# Patient Record
Sex: Female | Born: 1970 | Race: White | Hispanic: No | Marital: Married | Smoking: Current some day smoker
Health system: Southern US, Community
[De-identification: ages and names within clinical notes are randomized; demographics above are authoritative.]

## PROBLEM LIST (undated history)

## (undated) DIAGNOSIS — I7300 Raynaud's syndrome without gangrene: Secondary | ICD-10-CM

## (undated) DIAGNOSIS — F17200 Nicotine dependence, unspecified, uncomplicated: Secondary | ICD-10-CM

## (undated) DIAGNOSIS — K644 Residual hemorrhoidal skin tags: Secondary | ICD-10-CM

## (undated) HISTORY — DX: Raynaud's syndrome without gangrene: I73.00

## (undated) HISTORY — DX: Residual hemorrhoidal skin tags: K64.4

## (undated) HISTORY — DX: Nicotine dependence, unspecified, uncomplicated: F17.200

## (undated) HISTORY — PX: WISDOM TOOTH EXTRACTION: SHX21

---

## 1997-08-05 HISTORY — PX: MOUTH SURGERY: SHX715

## 2006-01-01 ENCOUNTER — Other Ambulatory Visit: Admission: RE | Admit: 2006-01-01 | Discharge: 2006-01-01 | Payer: Self-pay | Admitting: Gynecology

## 2007-04-09 ENCOUNTER — Encounter: Admission: RE | Admit: 2007-04-09 | Discharge: 2007-04-09 | Payer: Self-pay | Admitting: Obstetrics and Gynecology

## 2008-07-01 ENCOUNTER — Inpatient Hospital Stay (HOSPITAL_COMMUNITY): Admission: AD | Admit: 2008-07-01 | Discharge: 2008-07-03 | Payer: Self-pay | Admitting: Obstetrics and Gynecology

## 2010-04-27 ENCOUNTER — Emergency Department (HOSPITAL_COMMUNITY): Admission: EM | Admit: 2010-04-27 | Discharge: 2010-04-27 | Payer: Self-pay | Admitting: Emergency Medicine

## 2010-08-05 NOTE — L&D Delivery Note (Signed)
Delivery Note Pt received an epidural, progressed quickly to complete and pushed very well.  At 11:41 AM a viable and healthy female was delivered via Vaginal, Spontaneous Delivery (Presentation: Right Occiput Anterior).  APGAR: 9, 10; weight 8 lbs 5 oz .   Placenta status: Intact, Spontaneous.  Cord: 3 vessels with the following complications: None.  Anesthesia: Epidural  Episiotomy: None Lacerations: 2nd degree;Perineal Suture Repair: 3.0 vicryl Est. Blood Loss (mL):   Mom to postpartum.  Baby to nursery-stable.  Caitlen Worth D 05/27/2011, 12:04 PM

## 2010-08-26 ENCOUNTER — Encounter: Payer: Self-pay | Admitting: Obstetrics and Gynecology

## 2010-11-06 LAB — GC/CHLAMYDIA PROBE AMP, GENITAL: Chlamydia: NEGATIVE

## 2010-11-06 LAB — ANTIBODY SCREEN: Antibody Screen: NEGATIVE

## 2010-11-06 LAB — RUBELLA ANTIBODY, IGM: Rubella: NON-IMMUNE/NOT IMMUNE

## 2010-11-06 LAB — ABO/RH: RH Type: POSITIVE

## 2010-12-18 NOTE — Discharge Summary (Signed)
NAMEGWYNN, Anna Jimenez                ACCOUNT NO.:  [id number]   MEDICAL RECORD NO.:  [id number]          PATIENT TYPE:  INP   LOCATION:  9103                          FACILITY:  [facility]   PHYSICIAN:  Zenaida Niece, M.D.DATE OF BIRTH:  [birth year]   DATE OF ADMISSION:  07/01/2008  DATE OF DISCHARGE:  07/03/2008                               DISCHARGE SUMMARY   ADMISSION DIAGNOSIS:  Intrauterine pregnancy at 38 weeks.   DISCHARGE DIAGNOSIS:  Intrauterine pregnancy at 38 weeks.   PROCEDURE:  On July 01, 2008, she had a spontaneous vaginal delivery  with a third-degree laceration.   HISTORY AND PHYSICAL:  This is a 40 year old gravida 1, para 0 with an  EGA of [redacted] weeks, who presents with a complaint of leaking fluid since  about 0845 on the day of admission.  Prenatal care essentially  uncomplicated.   PRENATAL LABS:  Blood type is O positive with negative antibody screen,  RPR nonreactive, rubella nonimmune, HIV negative, gonorrhea and  chlamydia negative, first-trimester screen is normal, MSAFP is normal, 1-  hour Glucola 103, group B strep is negative.   PAST HISTORY:  She has an allergy to PENICILLIN.  No other significant  medical problems.   PHYSICAL EXAMINATION:  GENERAL:  She is afebrile with stable vital  signs.  Fetal heart tracing is normal.  ABDOMEN:  Soft.  On the fetal heart tracing, she also has contractions  every 2-3 minutes.  Cervix; 2-3, 90, vertex -1 to -2.   HOSPITAL COURSE:  The patient was admitted and continued to progress on  her own.  She received an epidural.  She progressed to complete, pushed  well and on the afternoon of July 01, 2008, had a vaginal delivery  of a viable female infant with Apgars of 9 and 9, weight 8 pounds and 3  ounces.  There was an occult cold cord prolapse.  Placenta was delivered  intact.  Uterus palpated normal.  She had a third-degree laceration  repaired with 1% lidocaine.  Estimated blood loss was 400 mL.  Postpartum,  she had no significant complications.  She remained  afebrile.  Predelivery hemoglobin 12.9 and postdelivery 10.4.  Predelivery platelets 119,000 and postdelivery platelets 98,000.  On  postpartum #2, she was felt to be stable enough for discharge home.   DISCHARGE INSTRUCTIONS:  Regular diet and pelvic rest.  Follow up in 6  weeks.   MEDICATIONS:  Over-the-counter ibuprofen as needed and she was given our  discharge pamphlet.      Zenaida Niece, M.D.  Electronically Signed     TDM/MEDQ  D:  07/03/2008  T:  07/03/2008  Job:  [id number]

## 2011-05-07 LAB — CBC
HCT: 37.5
MCHC: 34.3
MCHC: 34.4
MCV: 93.7
MCV: 94.3
Platelets: 119 — ABNORMAL LOW
Platelets: 98 — ABNORMAL LOW
RBC: 3.22 — ABNORMAL LOW
RDW: 12.9
RDW: 13.1

## 2011-05-15 ENCOUNTER — Telehealth (HOSPITAL_COMMUNITY): Payer: Self-pay | Admitting: *Deleted

## 2011-05-15 ENCOUNTER — Encounter (HOSPITAL_COMMUNITY): Payer: Self-pay | Admitting: *Deleted

## 2011-05-15 NOTE — Telephone Encounter (Signed)
Preadmission screen  

## 2011-05-24 ENCOUNTER — Inpatient Hospital Stay (HOSPITAL_COMMUNITY): Admission: RE | Admit: 2011-05-24 | Payer: Self-pay | Source: Ambulatory Visit

## 2011-05-27 ENCOUNTER — Encounter (HOSPITAL_COMMUNITY): Payer: Self-pay | Admitting: *Deleted

## 2011-05-27 ENCOUNTER — Inpatient Hospital Stay (HOSPITAL_COMMUNITY)
Admission: AD | Admit: 2011-05-27 | Discharge: 2011-05-28 | DRG: 373 | Disposition: A | Discharge status: Home / Self Care | Payer: BC Managed Care – PPO | Source: Ambulatory Visit | Attending: Obstetrics and Gynecology | Admitting: Obstetrics and Gynecology

## 2011-05-27 ENCOUNTER — Encounter (HOSPITAL_COMMUNITY): Payer: Self-pay | Admitting: Anesthesiology

## 2011-05-27 ENCOUNTER — Inpatient Hospital Stay (HOSPITAL_COMMUNITY): Payer: BC Managed Care – PPO | Admitting: Anesthesiology

## 2011-05-27 DIAGNOSIS — IMO0002 Reserved for concepts with insufficient information to code with codable children: Secondary | ICD-10-CM | POA: Diagnosis not present

## 2011-05-27 HISTORY — DX: Reserved for concepts with insufficient information to code with codable children: IMO0002

## 2011-05-27 LAB — CBC
HCT: 35.8 % — ABNORMAL LOW (ref 36.0–46.0)
Hemoglobin: 12.1 g/dL (ref 12.0–15.0)
MCH: 30.6 pg (ref 26.0–34.0)
MCV: 90.6 fL (ref 78.0–100.0)
RBC: 3.95 MIL/uL (ref 3.87–5.11)

## 2011-05-27 LAB — RPR: RPR Ser Ql: NONREACTIVE

## 2011-05-27 MED ORDER — BENZOCAINE-MENTHOL 20-0.5 % EX AERO
1.0000 "application " | INHALATION_SPRAY | CUTANEOUS | Status: DC | PRN
  Administered 2011-05-28: 1 via TOPICAL
  Filled 2011-05-27: qty 56

## 2011-05-27 MED ORDER — PHENYLEPHRINE 40 MCG/ML (10ML) SYRINGE FOR IV PUSH (FOR BLOOD PRESSURE SUPPORT): 80.0000 ug | PREFILLED_SYRINGE | INTRAVENOUS | Status: DC | PRN

## 2011-05-27 MED ORDER — LACTATED RINGERS IV SOLN
INTRAVENOUS | Status: DC
  Administered 2011-05-27 (×2): via INTRAVENOUS

## 2011-05-27 MED ORDER — LACTATED RINGERS IV SOLN: 500.0000 mL | Freq: Once | INTRAVENOUS | Status: DC

## 2011-05-27 MED ORDER — TETANUS-DIPHTH-ACELL PERTUSSIS 5-2.5-18.5 LF-MCG/0.5 IM SUSP
0.5000 mL | Freq: Once | INTRAMUSCULAR | Status: DC
  Filled 2011-05-27: qty 0.5

## 2011-05-27 MED ORDER — ACETAMINOPHEN 325 MG PO TABS: 650.0000 mg | ORAL_TABLET | ORAL | Status: DC | PRN

## 2011-05-27 MED ORDER — LANOLIN HYDROUS EX OINT: TOPICAL_OINTMENT | CUTANEOUS | Status: DC | PRN

## 2011-05-27 MED ORDER — METHYLERGONOVINE MALEATE 0.2 MG PO TABS: 0.2000 mg | ORAL_TABLET | ORAL | Status: DC | PRN

## 2011-05-27 MED ORDER — ONDANSETRON HCL 4 MG/2ML IJ SOLN: 4.0000 mg | INTRAMUSCULAR | Status: DC | PRN

## 2011-05-27 MED ORDER — METHYLERGONOVINE MALEATE 0.2 MG/ML IJ SOLN: 0.2000 mg | INTRAMUSCULAR | Status: DC | PRN

## 2011-05-27 MED ORDER — FENTANYL 2.5 MCG/ML BUPIVACAINE 1/10 % EPIDURAL INFUSION (WH - ANES)
14.0000 mL/h | INTRAMUSCULAR | Status: DC
  Filled 2011-05-27: qty 60

## 2011-05-27 MED ORDER — MEASLES, MUMPS & RUBELLA VAC ~~LOC~~ INJ
0.5000 mL | INJECTION | Freq: Once | SUBCUTANEOUS | Status: AC
  Administered 2011-05-28: 0.5 mL via SUBCUTANEOUS
  Filled 2011-05-27: qty 0.5

## 2011-05-27 MED ORDER — OXYTOCIN BOLUS FROM INFUSION
500.0000 mL | Freq: Once | INTRAVENOUS | Status: DC
  Filled 2011-05-27: qty 500
  Filled 2011-05-27: qty 1000

## 2011-05-27 MED ORDER — TETANUS-DIPHTH-ACELL PERTUSSIS 5-2.5-18.5 LF-MCG/0.5 IM SUSP
0.5000 mL | Freq: Once | INTRAMUSCULAR | Status: AC
  Administered 2011-05-27: 0.5 mL via INTRAMUSCULAR

## 2011-05-27 MED ORDER — LACTATED RINGERS IV SOLN: 500.0000 mL | INTRAVENOUS | Status: DC | PRN

## 2011-05-27 MED ORDER — PRENATAL PLUS 27-1 MG PO TABS
1.0000 | ORAL_TABLET | Freq: Every day | ORAL | Status: DC
  Administered 2011-05-28: 1 via ORAL
  Filled 2011-05-27: qty 1

## 2011-05-27 MED ORDER — SODIUM BICARBONATE 8.4 % IV SOLN
INTRAVENOUS | Status: DC | PRN
  Administered 2011-05-27: 3 mL via EPIDURAL

## 2011-05-27 MED ORDER — OXYTOCIN 20 UNITS IN LACTATED RINGERS INFUSION - SIMPLE
125.0000 mL/h | Freq: Once | INTRAVENOUS | Status: AC
  Administered 2011-05-27: 125 mL/h via INTRAVENOUS

## 2011-05-27 MED ORDER — IBUPROFEN 600 MG PO TABS
600.0000 mg | ORAL_TABLET | Freq: Four times a day (QID) | ORAL | Status: DC
  Administered 2011-05-27 – 2011-05-28 (×4): 600 mg via ORAL
  Filled 2011-05-27 (×4): qty 1

## 2011-05-27 MED ORDER — EPHEDRINE 5 MG/ML INJ
10.0000 mg | INTRAVENOUS | Status: DC | PRN
  Filled 2011-05-27: qty 4

## 2011-05-27 MED ORDER — DIBUCAINE 1 % RE OINT
1.0000 "application " | TOPICAL_OINTMENT | RECTAL | Status: DC | PRN
  Filled 2011-05-27: qty 28

## 2011-05-27 MED ORDER — EPHEDRINE 5 MG/ML INJ: 10.0000 mg | INTRAVENOUS | Status: DC | PRN

## 2011-05-27 MED ORDER — DIPHENHYDRAMINE HCL 50 MG/ML IJ SOLN: 12.5000 mg | INTRAMUSCULAR | Status: DC | PRN

## 2011-05-27 MED ORDER — PHENYLEPHRINE 40 MCG/ML (10ML) SYRINGE FOR IV PUSH (FOR BLOOD PRESSURE SUPPORT)
80.0000 ug | PREFILLED_SYRINGE | INTRAVENOUS | Status: DC | PRN
  Filled 2011-05-27: qty 5

## 2011-05-27 MED ORDER — MAGNESIUM HYDROXIDE 400 MG/5ML PO SUSP: 30.0000 mL | ORAL | Status: DC | PRN

## 2011-05-27 MED ORDER — SIMETHICONE 80 MG PO CHEW: 80.0000 mg | CHEWABLE_TABLET | ORAL | Status: DC | PRN

## 2011-05-27 MED ORDER — OXYCODONE-ACETAMINOPHEN 5-325 MG PO TABS
1.0000 | ORAL_TABLET | ORAL | Status: DC | PRN
  Administered 2011-05-28: 1 via ORAL
  Filled 2011-05-27: qty 1

## 2011-05-27 MED ORDER — LIDOCAINE HCL (PF) 1 % IJ SOLN
30.0000 mL | INTRAMUSCULAR | Status: DC | PRN
  Filled 2011-05-27: qty 30

## 2011-05-27 MED ORDER — FENTANYL 2.5 MCG/ML BUPIVACAINE 1/10 % EPIDURAL INFUSION (WH - ANES)
INTRAMUSCULAR | Status: DC | PRN
  Administered 2011-05-27: 12 mL/h via EPIDURAL

## 2011-05-27 MED ORDER — CITRIC ACID-SODIUM CITRATE 334-500 MG/5ML PO SOLN: 30.0000 mL | ORAL | Status: DC | PRN

## 2011-05-27 MED ORDER — OXYCODONE-ACETAMINOPHEN 5-325 MG PO TABS: 2.0000 | ORAL_TABLET | ORAL | Status: DC | PRN

## 2011-05-27 MED ORDER — DIPHENHYDRAMINE HCL 25 MG PO CAPS: 25.0000 mg | ORAL_CAPSULE | Freq: Four times a day (QID) | ORAL | Status: DC | PRN

## 2011-05-27 MED ORDER — OXYTOCIN 20 UNITS IN LACTATED RINGERS INFUSION - SIMPLE: 125.0000 mL/h | INTRAVENOUS | Status: DC | PRN

## 2011-05-27 MED ORDER — WITCH HAZEL-GLYCERIN EX PADS: 1.0000 "application " | MEDICATED_PAD | CUTANEOUS | Status: DC | PRN

## 2011-05-27 MED ORDER — ONDANSETRON HCL 4 MG PO TABS: 4.0000 mg | ORAL_TABLET | ORAL | Status: DC | PRN

## 2011-05-27 MED ORDER — ZOLPIDEM TARTRATE 5 MG PO TABS: 5.0000 mg | ORAL_TABLET | Freq: Every evening | ORAL | Status: DC | PRN

## 2011-05-27 MED ORDER — ONDANSETRON HCL 4 MG/2ML IJ SOLN: 4.0000 mg | Freq: Four times a day (QID) | INTRAMUSCULAR | Status: DC | PRN

## 2011-05-27 MED ORDER — LIDOCAINE HCL 1.5 % IJ SOLN
INTRAMUSCULAR | Status: DC | PRN
  Administered 2011-05-27 (×2): 5 mL via EPIDURAL

## 2011-05-27 MED ORDER — IBUPROFEN 600 MG PO TABS: 600.0000 mg | ORAL_TABLET | Freq: Four times a day (QID) | ORAL | Status: DC | PRN

## 2011-05-27 MED ORDER — SENNOSIDES-DOCUSATE SODIUM 8.6-50 MG PO TABS
2.0000 | ORAL_TABLET | Freq: Every day | ORAL | Status: DC
  Administered 2011-05-27: 2 via ORAL

## 2011-05-27 NOTE — Progress Notes (Signed)
?  srom at 0430, big gush, then trickling.  Started as clear, then pink tinged.  Ctx's started after rupture, are mild.

## 2011-05-27 NOTE — Anesthesia Procedure Notes (Signed)
Epidural Patient location during procedure: OB Start time: 05/27/2011 10:21 AM End time: 05/27/2011 10:27 AM Reason for block: procedure for pain  Staffing Anesthesiologist: Sandrea Hughs Performed by: anesthesiologist   Preanesthetic Checklist Completed: patient identified, site marked, surgical consent, pre-op evaluation, timeout performed, IV checked, risks and benefits discussed and monitors and equipment checked  Epidural Patient position: sitting Prep: site prepped and draped and DuraPrep Patient monitoring: continuous pulse ox and blood pressure Approach: midline Injection technique: LOR air  Needle:  Needle type: Tuohy  Needle gauge: 17 G Needle length: 9 cm Needle insertion depth: 6 cm Catheter type: closed end flexible Catheter size: 19 Gauge Catheter at skin depth: 11 cm Test dose: negative and 1.5% lidocaine  Assessment Sensory level: T9 Events: blood not aspirated, injection not painful, no injection resistance, negative IV test and no paresthesia

## 2011-05-27 NOTE — Anesthesia Preprocedure Evaluation (Signed)
Anesthesia Evaluation  Patient identified by MRN, date of birth, ID band Patient awake  General Assessment Comment  Reviewed: Allergy & Precautions, H&P , NPO status , Patient's Chart, lab work & pertinent test results  Airway Mallampati: II TM Distance: >3 FB Neck ROM: full    Dental No notable dental hx.    Pulmonary  clear to auscultation  Pulmonary exam normal       Cardiovascular Normal    Neuro/Psych Negative Neurological ROS  Negative Psych ROS   GI/Hepatic negative GI ROS Neg liver ROS    Endo/Other  Negative Endocrine ROS  Renal/GU negative Renal ROS  Genitourinary negative   Musculoskeletal   Abdominal Normal abdominal exam  (+)   Peds negative pediatric ROS (+)  Hematology negative hematology ROS (+)   Anesthesia Other Findings   Reproductive/Obstetrics (+) Pregnancy                           Anesthesia Physical Anesthesia Plan  ASA: II  Anesthesia Plan: Epidural   Post-op Pain Management:    Induction:   Airway Management Planned:   Additional Equipment:   Intra-op Plan:   Post-operative Plan:   Informed Consent: I have reviewed the patients History and Physical, chart, labs and discussed the procedure including the risks, benefits and alternatives for the proposed anesthesia with the patient or authorized representative who has indicated his/her understanding and acceptance.     Plan Discussed with:   Anesthesia Plan Comments:         Anesthesia Quick Evaluation

## 2011-05-27 NOTE — H&P (Signed)
Anna Jimenez is a 40 y.o. female, G2 P1001, EGA [redacted] weeks presenting for evaluation of leaking fluid since about 0430 today, some ctx.  She had a large gush of fluid, some trickled after that.  On evaluation here, not grossly ruptured, fern neg.   Prenatal care essentially uncomplicated, see prenatal records for complete history.    Maternal Medical History:  Reason for admission: Reason for admission: rupture of membranes.  Contractions: Frequency: irregular.   Perceived severity is moderate.    Fetal activity: Perceived fetal activity is normal.    Prenatal complications: no prenatal complications   OB History    Grav Para Term Preterm Abortions TAB SAB Ect Mult Living   2 1 1       1      Past Medical History  Diagnosis Date  . Raynaud's disease    Past Surgical History  Procedure Date  . Mouth surgery 1999   Family History: family history includes Diabetes in her maternal grandfather; Hypertension in her mother; and Polymyositis in her mother.  There is no history of Anesthesia problems, and Hypotension, and Malignant hyperthermia, and Pseudochol deficiency, . Social History:  reports that she has never smoked. She has never used smokeless tobacco. She reports that she does not drink alcohol or use illicit drugs.  Review of Systems  Respiratory: Negative.   Cardiovascular: Negative.    VE- 4/80/-1, vtx, AROM of at least a forebag- clear  VBlood pressure 125/88, pulse 81, temperature 98.7 F (37.1 C), temperature source Oral, resp. rate 16, height 5' 9.5" (1.765 m), weight 104.951 kg (231 lb 6 oz). Maternal Exam:  Uterine Assessment: Contraction strength is moderate.  Contraction frequency is irregular.   Abdomen: Patient reports no abdominal tenderness. Estimated fetal weight is 7 1/2 lbs.   Fetal presentation: vertex  Introitus: Normal vulva. Normal vagina.    Fetal Exam Fetal Monitor Review: Mode: ultrasound.   Baseline rate: 140s.  Variability: minimal (<5 bpm).    Pattern: no decelerations and no accelerations.    Fetal State Assessment: Category II - tracings are indeterminate.     Physical Exam  Constitutional: She appears well-developed and well-nourished.  Neck: Neck supple. No thyromegaly present.  Cardiovascular: Normal rate, regular rhythm and normal heart sounds.   No murmur heard. Respiratory: Breath sounds normal. No respiratory distress. She has no wheezes.  GI: Soft.    Prenatal labs: ABO, Rh: O/Positive/-- (04/03 0000) Antibody: Negative (04/03 0000) Rubella: Nonimmune (04/03 0000) RPR: Nonreactive (04/03 0000)  HBsAg: Negative (04/03 0000)  HIV: Non-reactive (04/03 0000)  GBS: Negative (09/24 0000)   Assessment/Plan: IUP at 40 weeks, possibly with SROM but appears to be in early labor.  AROM of at least a forebag done for augmentation.  Will monitor progress.     Rosalie Gelpi D 05/27/2011, 8:46 AM

## 2011-05-28 MED ORDER — BENZOCAINE-MENTHOL 20-0.5 % EX AERO
INHALATION_SPRAY | CUTANEOUS | Status: AC
  Filled 2011-05-28: qty 56

## 2011-05-28 MED ORDER — OXYCODONE-ACETAMINOPHEN 5-325 MG PO TABS: 1.0000 | ORAL_TABLET | ORAL | Status: AC | PRN

## 2011-05-28 NOTE — Anesthesia Postprocedure Evaluation (Signed)
Anesthesia Post Note  Patient: Anna Jimenez  Procedure(s) Performed: * No procedures listed *  Anesthesia type: Epidural  Patient location: Mother/Baby  Post pain: Pain level controlled  Post assessment: Post-op Vital signs reviewed  Last Vitals:  Filed Vitals:   05/28/11 0611  BP: 121/83  Pulse: 80  Temp: 36.8 C  Resp: 18    Post vital signs: Reviewed  Level of consciousness: awake  Complications: No apparent anesthesia complications

## 2011-05-28 NOTE — Discharge Summary (Signed)
Obstetric Discharge Summary Reason for Admission: rupture of membranes Prenatal Procedures: none Intrapartum Procedures: spontaneous vaginal delivery Postpartum Procedures: none Complications-Operative and Postpartum: 2nd degree perineal laceration Hemoglobin  Date Value Range Status  05/27/2011 12.1  12.0-15.0 (g/dL) Final     HCT  Date Value Range Status  05/27/2011 35.8* 36.0-46.0 (%) Final    Discharge Diagnoses: Term Pregnancy-delivered  Discharge Information: Date: 05/28/2011 Activity: pelvic rest Diet: routine Medications: Ibuprofen and Percocet Condition: stable Instructions: refer to practice specific booklet Discharge to: home Follow-up Information    Follow up with [name] D, MD. Make an appointment in 6 weeks.   Contact information:   [address] [place] [address] [phone]          Newborn Data: Live born female  Birth Weight: 8 lb 5.3 oz (3780 g) APGAR: 9, 10  Home with mother.  [name] D 05/28/2011, 8:27 AM

## 2011-05-28 NOTE — Progress Notes (Signed)
PPD#1 Doing well, wants to go home today Afeb, VSS Fundus- firm, NT at U-0 Continue routine postpartum care, d/c home if baby can go home.

## 2011-05-29 ENCOUNTER — Inpatient Hospital Stay (HOSPITAL_COMMUNITY): Admission: RE | Admit: 2011-05-29 | Payer: Self-pay | Source: Ambulatory Visit

## 2012-07-31 ENCOUNTER — Encounter (INDEPENDENT_AMBULATORY_CARE_PROVIDER_SITE_OTHER): Payer: Self-pay

## 2012-08-03 ENCOUNTER — Telehealth (INDEPENDENT_AMBULATORY_CARE_PROVIDER_SITE_OTHER): Payer: Self-pay | Admitting: General Surgery

## 2012-08-03 ENCOUNTER — Ambulatory Visit (INDEPENDENT_AMBULATORY_CARE_PROVIDER_SITE_OTHER): Payer: BC Managed Care – PPO | Admitting: General Surgery

## 2012-08-03 ENCOUNTER — Encounter (INDEPENDENT_AMBULATORY_CARE_PROVIDER_SITE_OTHER): Payer: Self-pay | Admitting: General Surgery

## 2012-08-03 VITALS — BP 124/82 | HR 88 | Temp 96.7°F | Resp 18 | Ht 70.0 in | Wt 166.0 lb

## 2012-08-03 DIAGNOSIS — K612 Anorectal abscess: Secondary | ICD-10-CM

## 2012-08-03 DIAGNOSIS — K611 Rectal abscess: Secondary | ICD-10-CM

## 2012-08-03 NOTE — Patient Instructions (Signed)
The swollen area that is draining appears to be a perirectal abscess in the anterior midline. This has recurred despite what sounds like adequate treatment.  You  will be scheduled for surgery in the near future for examination under anesthesia and drainage of the perirectal abscess. If we find a fistula in ano we will treat that as well.  Until the surgery is performed I recommended that you take a warm tub bath 2 or 3 times a day. You have also been given a prescription for Cipro and Flagyl antibiotic to take for 7 days.      Peri-Rectal Abscess Your caregiver has diagnosed you as having a peri-rectal abscess. This is an infected area near the rectum that is filled with pus. If the abscess is near the surface of the skin, your caregiver may open (incise) the area and drain the pus. HOME CARE INSTRUCTIONS   If your abscess was opened up and drained. A small piece of gauze may be placed in the opening so that it can drain. Do not remove the gauze unless directed by your caregiver.  A loose dressing may be placed over the abscess site. Change the dressing as often as necessary to keep it clean and dry.  After the drain is removed, the area may be washed with a gentle antiseptic (soap) four times per day.  A warm sitz bath, warm packs or heating pad may be used for pain relief, taking care not to burn yourself.  Return for a wound check in 1 day or as directed.  An "inflatable doughnut" may be used for sitting with added comfort. These can be purchased at a drugstore or medical supply house.  To reduce pain and straining with bowel movements, eat a high fiber diet with plenty of fruits and vegetables. Use stool softeners as recommended by your caregiver. This is especially important if narcotic type pain medications were prescribed as these may cause marked constipation.  Only take over-the-counter or prescription medicines for pain, discomfort, or fever as directed by your caregiver. SEEK  IMMEDIATE MEDICAL CARE IF:   You have increasing pain that is not controlled by medication.  There is increased inflammation (redness), swelling, bleeding, or drainage from the area.  An oral temperature above 102 F (38.9 C) develops.  You develop chills or generalized malaise (feel lethargic or feel "washed out").  You develop any new symptoms (problems) you feel may be related to your present problem. Document Released: 07/19/2000 Document Revised: 10/14/2011 Document Reviewed: 07/19/2008 [facility] Patient Information 2013 [place], [place].

## 2012-08-03 NOTE — Telephone Encounter (Signed)
Called patient per Dr. Jacinto Halim request and asked the patient to arrive earlier due to procedure scheduled during clinic. Patient agreed to get here as quickly as she can, due to the babysitter arriving at 10:00.

## 2012-08-03 NOTE — Progress Notes (Signed)
Patient ID: Anna Jimenez, female   DOB: 1971-06-05, 41 y.o.   MRN: [id number]  Chief Complaint  Patient presents with  . Pre-op Exam    evaul peri anal mass    HPI Anna Jimenez is a 41 y.o. female.  She is recurred by Dr. Lynnea Ferrier at [facility] for evaluation of a recurrent cystic mass on the perineum.  The patient has had some minor problems with hemorrhoids over the years. She has had a swollen area in the midline in the perineum for about 3 weeks. 2 weeks ago she underwent incision and drainage of this area and was placed on Bactrim. She improved transiently but over the past 2 weeks she has had cycles of swelling and discomfort and then discharge and improvement but continues to recur. This been no change in her bowel habits. Normal appetite. No abdominal pain. Otherwise healthy. No prior rectal surgery.  She does run regularly. 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 She has 2 children. OB delivery and episiotomy one year ago and she questions whether the sutures could have caused this. HPI  Past Medical History  Diagnosis Date  . Raynaud's disease   . External hemorrhoid     Past Surgical History  Procedure Date  . Mouth surgery 1999  . Wisdom tooth extraction 90's    Family History  Problem Relation Age of Onset  . Hypertension Mother   . Polymyositis Mother   . Diabetes Maternal Grandfather   . Anesthesia problems Neg Hx   . Hypotension Neg Hx   . Malignant hyperthermia Neg Hx   . Pseudochol deficiency Neg Hx     Social History History  Substance Use Topics  . Smoking status: Current Some Day Smoker    Types: Cigarettes  . Smokeless tobacco: Never Used  . Alcohol Use: Yes     Comment: Social    Allergies  Allergen Reactions  . Penicillins Other (See Comments)    Unsure- childhood reaction    No current outpatient prescriptions on file.    Review of Systems Review of Systems  Constitutional: Negative for fever,  chills and unexpected weight change.  HENT: Negative for hearing loss, congestion, sore throat, trouble swallowing and voice change.   Eyes: Negative for visual disturbance.  Respiratory: Negative for cough and wheezing.   Cardiovascular: Positive for chest pain. Negative for palpitations and leg swelling.  Gastrointestinal: Positive for rectal pain. Negative for nausea, vomiting, abdominal pain, diarrhea, constipation, blood in stool, abdominal distention and anal bleeding.  Genitourinary: Negative for hematuria, vaginal bleeding and difficulty urinating.  Musculoskeletal: Negative for arthralgias.  Skin: Negative for rash and wound.  Neurological: Negative for seizures, syncope and headaches.  Hematological: Negative for adenopathy. Does not bruise/bleed easily.  Psychiatric/Behavioral: Negative for confusion.    Blood pressure 124/82, pulse 88, temperature 96.7 F (35.9 C), temperature source Temporal, resp. rate 18, height 5\' 10"  (1.778 m), weight 166 lb (75.297 kg), SpO2 97.00%.  Physical Exam Physical Exam  Constitutional: She is oriented to person, place, and time. She appears well-developed and well-nourished. No distress.  HENT:  Head: Normocephalic and atraumatic.  Nose: Nose normal.  Mouth/Throat: No oropharyngeal exudate.  Eyes: Conjunctivae normal and EOM are normal. Pupils are equal, round, and reactive to light. Left eye exhibits no discharge. No scleral icterus.  Neck: Neck supple. No JVD present. No tracheal deviation present. No thyromegaly present.  Cardiovascular: Normal rate, regular rhythm, normal heart sounds and intact  distal pulses.   No murmur heard. Pulmonary/Chest: Effort normal and breath sounds normal. No respiratory distress. She has no wheezes. She has no rales. She exhibits no tenderness.  Abdominal: Soft. Bowel sounds are normal. She exhibits no distension and no mass. There is no tenderness. There is no rebound and no guarding.  Genitourinary:        There is a tender, indurated, 2 cm x 1 cm mass in the anterior midline. This is much closer to the anal verge that it is to the vagina. The posterior vaginal opening looks normal. I was able to express a little bit of drainage from this mass. the rectal exam reveals normal sphincter tone, some tenderness anteriorly. Too tender for anoscopy to rule out fistula.  Musculoskeletal: She exhibits no edema and no tenderness.  Lymphadenopathy:    She has no cervical adenopathy.  Neurological: She is alert and oriented to person, place, and time. She exhibits normal muscle tone. Coordination normal.  Skin: Skin is warm. No rash noted. She is not diaphoretic. No erythema. No pallor.  Psychiatric: She has a normal mood and affect. Her behavior is normal. Judgment and thought content normal.    Data Reviewed Office notes from [facility]  Assessment    Chronic, recurrent perirectal abscess, anterior midline.  My concern is that this recurred quickly following what appears to be adequate therapy. We will need to rule out fistula in anal. This will require examination and surgical treatment under anesthesia. The patient is in full agreement with this because of the tenderness    Plan    Scheduled for examination under anesthesia, incision and drainage of chronic perianal abscess, anterior midline, possible repair fistula in ano as soon as possible in the near future.  I discussed the indications, details, techniques, and numerous riss of the surgery with the patient. I discussed back to work and temporary disability issues. She understands these issues. All her questions are answered. She agrees with this plan.  I have placed her on Cipro and Flagyl for 7 days, and advised warm tub baths 3 times daily in the interim.       Angelia Mould. Derrell Lolling, M.D., [facility] General and Minimally invasive Surgery Breast and Colorectal Surgery Office:    [phone] Pager:   [phone]  08/03/2012, 11:41 AM

## 2012-08-12 ENCOUNTER — Other Ambulatory Visit (INDEPENDENT_AMBULATORY_CARE_PROVIDER_SITE_OTHER): Payer: Self-pay | Admitting: General Surgery

## 2012-08-12 DIAGNOSIS — K603 Anal fistula: Secondary | ICD-10-CM

## 2012-08-12 DIAGNOSIS — K612 Anorectal abscess: Secondary | ICD-10-CM

## 2012-08-12 HISTORY — PX: ANAL EXAMINATION UNDER ANESTHESIA: SHX1138

## 2012-09-04 ENCOUNTER — Ambulatory Visit (INDEPENDENT_AMBULATORY_CARE_PROVIDER_SITE_OTHER): Payer: BC Managed Care – PPO | Admitting: General Surgery

## 2012-09-04 ENCOUNTER — Encounter (INDEPENDENT_AMBULATORY_CARE_PROVIDER_SITE_OTHER): Payer: Self-pay | Admitting: General Surgery

## 2012-09-04 VITALS — BP 120/72 | HR 70 | Temp 97.4°F | Resp 18 | Ht 70.0 in | Wt 164.2 lb

## 2012-09-04 DIAGNOSIS — K603 Anal fistula: Secondary | ICD-10-CM | POA: Insufficient documentation

## 2012-09-04 NOTE — Patient Instructions (Addendum)
You are almost completely healed from your rectal surgery.  We found a chronic abscess and a fistula in ano, and we have surgically repaired all of this.  I strongly urge you to drink 6-8 glasses of water per day.  I recommended that you take psyllium fiber capsules once or twice a day as instructed.  You may resume all normal physical activities without restriction.  Return to see Dr. Derrell Lolling if further problems arise.

## 2012-09-04 NOTE — Progress Notes (Signed)
Patient ID: Anna Jimenez, female   DOB: [birth year], 42 y.o.   MRN: [id number] History: This patient was taken to the operating room on 08/12/2012. 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 Was divided, dividing the internal sphincter mechanism. She has done very well. She says her discomfort is resolving. Her bleeding has completely resolved. She occasionally has some hard stools. Pathology shows benign squamous mucosa and chronic inflammation.  Exam: Patient was well. No distress Rectal exam shows small area of granulation tissue in the midline. No purulence. No mass. Normal to slightly increased sphincter tone.  Assessment: Fistula in ANO, intersphincteric associated chronic abscess, recovering uneventfully following surgical repair Constipation  Plan: Hydration urge and stress Metamucil capsules, , twice a day High-fiber, low-fat diet Anticipate complete healing within one month Return to see me if further problems arise.   Angelia Mould. Derrell Lolling, M.D., [facility] General and Minimally invasive Surgery Breast and Colorectal Surgery Office:   [phone] Pager:   [phone]

## 2013-05-14 ENCOUNTER — Ambulatory Visit (INDEPENDENT_AMBULATORY_CARE_PROVIDER_SITE_OTHER): Payer: No Typology Code available for payment source | Admitting: Family Medicine

## 2013-05-14 ENCOUNTER — Encounter: Payer: Self-pay | Admitting: Family Medicine

## 2013-05-14 VITALS — BP 122/76 | HR 64 | Temp 98.0°F | Resp 18 | Ht 67.75 in | Wt 168.0 lb

## 2013-05-14 DIAGNOSIS — T148XXA Other injury of unspecified body region, initial encounter: Secondary | ICD-10-CM

## 2013-05-14 DIAGNOSIS — J069 Acute upper respiratory infection, unspecified: Secondary | ICD-10-CM

## 2013-05-14 MED ORDER — CYCLOBENZAPRINE HCL 10 MG PO TABS: 10.0000 mg | ORAL_TABLET | Freq: Three times a day (TID) | ORAL | Status: DC | PRN

## 2013-05-14 NOTE — Patient Instructions (Signed)
Take muscle relaxer at bedtime  Continue advil Robitussin DM or Delsym over the counter  Call if no improvement

## 2013-05-16 DIAGNOSIS — T148XXA Other injury of unspecified body region, initial encounter: Secondary | ICD-10-CM | POA: Insufficient documentation

## 2013-05-16 DIAGNOSIS — J069 Acute upper respiratory infection, unspecified: Secondary | ICD-10-CM | POA: Insufficient documentation

## 2013-05-16 HISTORY — DX: Acute upper respiratory infection, unspecified: J06.9

## 2013-05-16 NOTE — Assessment & Plan Note (Signed)
I think this is due to severe coughing Give muscle relaxant continue NSAIDS Heating pad

## 2013-05-16 NOTE — Progress Notes (Signed)
  Subjective:    Patient ID: Anna Jimenez, female    DOB: 1971/05/14, 42 y.o.   MRN: [id number]  HPI  Pt here with left sided muscle pain, feels like she pulled something, has a catch worse after coughing fits. Had cough with mild production for the past 2 1/2 weeks. No SOB, no chest pain, Daughter had URI first. Cough is improving but has muscle aches. No OTC meds taken.   Review of Systems - per above  GEN- denies fatigue, fever, weight loss,weakness, recent illness HEENT- denies eye drainage, change in vision, nasal discharge, CVS- denies chest pain, palpitations RESP- denies SOB, +cough, wheeze ABD- denies N/V, change in stools, abd pain MSK- denies joint pain, +muscle aches, injury Neuro- denies headache, dizziness, syncope, seizure activity      Objective:   Physical Exam  GEN- NAD, alert and oriented x3 HEENT- PERRL, EOMI, non injected sclera, pink conjunctiva, MMM, oropharynx clear, TM clear bilat no effusion, No maxillary sinus tenderness, nares clear Neck- Supple, no LAD CVS- RRR, no murmur RESP-CTAB MSK- TTP Left intercostals around T8 region, +spasm in this area, Spine NT, no paraspinal tenderness, normal Flexion/extension back EXT- No edema Pulses- Radial 2+          Assessment & Plan:

## 2013-05-16 NOTE — Assessment & Plan Note (Signed)
OTC cough suppressant, supportive care If she does not improve or worsens get CXR

## 2013-10-05 ENCOUNTER — Ambulatory Visit (INDEPENDENT_AMBULATORY_CARE_PROVIDER_SITE_OTHER): Payer: PRIVATE HEALTH INSURANCE | Admitting: Family Medicine

## 2013-10-05 ENCOUNTER — Encounter: Payer: Self-pay | Admitting: Family Medicine

## 2013-10-05 VITALS — BP 112/74 | HR 72 | Temp 97.1°F | Resp 18 | Ht 69.5 in | Wt 170.0 lb

## 2013-10-05 DIAGNOSIS — R6889 Other general symptoms and signs: Secondary | ICD-10-CM

## 2013-10-05 DIAGNOSIS — R509 Fever, unspecified: Secondary | ICD-10-CM

## 2013-10-05 MED ORDER — OSELTAMIVIR PHOSPHATE 75 MG PO CAPS: 75.0000 mg | ORAL_CAPSULE | Freq: Two times a day (BID) | ORAL | Status: DC

## 2013-10-05 NOTE — Progress Notes (Signed)
   Subjective:    Patient ID: Anna Jimenez, female    DOB: 09/28/1970, 43 y.o.   MRN: [id number]  HPI Patient reports 24 hours of fever to 102, diffuse myalgias and body aches, mild sore throat, mild rhinorrhea, bowel headache, and nonproductive cough. Her children have recently had viral symptoms and fevers. She denies nausea vomiting diarrhea or dysuria Past Medical History  Diagnosis Date  . Raynaud's disease   . External hemorrhoid    Current Outpatient Prescriptions on File Prior to Visit  Medication Sig Dispense Refill  . docusate sodium (COLACE) 50 MG capsule Take 50 mg by mouth 2 (two) times daily. Patient taking the generic version of this purchased at [facility].      . levonorgestrel (MIRENA) 20 MCG/24HR IUD 1 each by Intrauterine route once.       No current facility-administered medications on file prior to visit.   Allergies  Allergen Reactions  . Penicillins Other (See Comments)    Unsure- childhood reaction   History   Social History  . Marital Status: Married    Spouse Name: N/A    Number of Children: N/A  . Years of Education: N/A   Occupational History  . Not on file.   Social History Main Topics  . Smoking status: Current Some Day Smoker    Types: Cigarettes  . Smokeless tobacco: Never Used  . Alcohol Use: Yes     Comment: Social  . Drug Use: No  . Sexual Activity: Yes   Other Topics Concern  . Not on file   Social History Narrative  . No narrative on file      Review of Systems  All other systems reviewed and are negative.       Objective:   Physical Exam  Vitals reviewed. Constitutional: She appears well-developed and well-nourished.  HENT:  Right Ear: External ear normal.  Left Ear: External ear normal.  Nose: Nose normal.  Mouth/Throat: Oropharyngeal exudate present.  Neck: Neck supple.  Cardiovascular: Normal rate, regular rhythm and normal heart sounds.  Exam reveals no gallop and no friction rub.   No murmur  heard. Pulmonary/Chest: Effort normal and breath sounds normal. No respiratory distress. She has no wheezes. She has no rales.  Abdominal: Soft. Bowel sounds are normal. She exhibits no distension. There is no tenderness. There is no rebound.  Lymphadenopathy:       Head (left side): Submandibular adenopathy present.    She has cervical adenopathy.          Assessment & Plan:  1. Fever, unspecified - Influenza a and b  2. Flu-like symptoms The patient has influenza. Begin Tamiflu 75 mg by mouth twice a day for 5 days. She does use ibuprofen 800 mg every 8 hours as needed for pain or fever. The differential diagnosis also includes mono. She continues to have a swollen tender lymph node or her symptoms worsen, I would like her to return for Monospot as well as a CBC. - oseltamivir (TAMIFLU) 75 MG capsule; Take 1 capsule (75 mg total) by mouth 2 (two) times daily.  Dispense: 10 capsule; Refill: 0

## 2013-10-07 LAB — INFLUENZA A AND B
Inflenza A Ag: NEGATIVE
Influenza B Ag: NEGATIVE

## 2013-10-11 ENCOUNTER — Telehealth: Payer: Self-pay | Admitting: Family Medicine

## 2013-10-11 DIAGNOSIS — J029 Acute pharyngitis, unspecified: Secondary | ICD-10-CM

## 2013-10-11 DIAGNOSIS — R509 Fever, unspecified: Secondary | ICD-10-CM

## 2013-10-11 NOTE — Telephone Encounter (Signed)
I would recommend coming by for ebv igm, igg, and strep throat test.  I suspect still the flu which lasts 7-10 days.

## 2013-10-11 NOTE — Telephone Encounter (Signed)
In last week was given Tamiflu.  Since Saturday has had terrible sore throat, low grade fever, more congestion and cough.  She said you mentioned if not better doing Mono test.  What should she do?

## 2013-10-11 NOTE — Telephone Encounter (Signed)
Pt called and will come tomorrow for lab work.

## 2013-10-12 ENCOUNTER — Other Ambulatory Visit: Payer: PRIVATE HEALTH INSURANCE

## 2013-10-12 DIAGNOSIS — J029 Acute pharyngitis, unspecified: Secondary | ICD-10-CM

## 2013-10-12 DIAGNOSIS — R509 Fever, unspecified: Secondary | ICD-10-CM

## 2013-10-12 LAB — RAPID STREP SCREEN (MED CTR MEBANE ONLY): STREPTOCOCCUS, GROUP A SCREEN (DIRECT): POSITIVE — AB

## 2013-10-12 MED ORDER — AZITHROMYCIN 250 MG PO TABS: ORAL_TABLET | ORAL | Status: DC

## 2013-10-12 MED ORDER — VALACYCLOVIR HCL 1 G PO TABS: 2000.0000 mg | ORAL_TABLET | Freq: Two times a day (BID) | ORAL | Status: DC

## 2013-10-13 LAB — EPSTEIN-BARR VIRUS NUCLEAR ANTIGEN ANTIBODY, IGG: EBV NA IgG: >600 U/mL — ABNORMAL HIGH (ref <18.0)

## 2013-10-13 LAB — EPSTEIN-BARR VIRUS VCA, IGM: EBV VCA IgM: 24.5 U/mL (ref <36.0)

## 2014-06-06 ENCOUNTER — Encounter: Payer: Self-pay | Admitting: Family Medicine

## 2014-06-27 ENCOUNTER — Encounter: Payer: Self-pay | Admitting: Family Medicine

## 2014-06-27 ENCOUNTER — Ambulatory Visit (INDEPENDENT_AMBULATORY_CARE_PROVIDER_SITE_OTHER): Payer: Managed Care, Other (non HMO) | Admitting: Family Medicine

## 2014-06-27 VITALS — BP 128/80 | HR 72 | Temp 98.5°F | Resp 16 | Ht 69.5 in | Wt 173.0 lb

## 2014-06-27 DIAGNOSIS — R509 Fever, unspecified: Secondary | ICD-10-CM

## 2014-06-27 DIAGNOSIS — B349 Viral infection, unspecified: Secondary | ICD-10-CM

## 2014-06-27 LAB — INFLUENZA A AND B
INFLUENZA B AG: NEGATIVE
Inflenza A Ag: NEGATIVE

## 2014-06-27 MED ORDER — OSELTAMIVIR PHOSPHATE 75 MG PO CAPS: 75.0000 mg | ORAL_CAPSULE | Freq: Two times a day (BID) | ORAL | Status: DC

## 2014-06-27 NOTE — Progress Notes (Signed)
   Subjective:    Patient ID: Anna Jimenez, female    DOB: 06/07/1971, 43 y.o.   MRN: [id number]  HPI  Patient developed flulike symptoms 48 hours ago. Symptoms consist of fever to 102, diffuse myalgias and arthralgias, head congestion, a nonproductive cough,. She denies any rhinorrhea. She denies any otalgia. She denies any sinus pain. She denies any nausea vomiting or diarrhea. She denies any dysuria or rashes. Past Medical History  Diagnosis Date  . Raynaud's disease   . External hemorrhoid    Past Surgical History  Procedure Laterality Date  . Mouth surgery  1999  . Wisdom tooth extraction  90's  . Anal examination under anesthesia  08/12/12    and I&D   Current Outpatient Prescriptions on File Prior to Visit  Medication Sig Dispense Refill  . levonorgestrel (MIRENA) 20 MCG/24HR IUD 1 each by Intrauterine route once.    . valACYclovir (VALTREX) 1000 MG tablet Take 2 tablets (2,000 mg total) by mouth 2 (two) times daily. 4 tablet 1   No current facility-administered medications on file prior to visit.   Allergies  Allergen Reactions  . Penicillins Other (See Comments)    Unsure- childhood reaction   History   Social History  . Marital Status: Married    Spouse Name: N/A    Number of Children: N/A  . Years of Education: N/A   Occupational History  . Not on file.   Social History Main Topics  . Smoking status: Current Some Day Smoker    Types: Cigarettes  . Smokeless tobacco: Never Used  . Alcohol Use: Yes     Comment: Social  . Drug Use: No  . Sexual Activity: Yes   Other Topics Concern  . Not on file   Social History Narrative     Review of Systems  All other systems reviewed and are negative.      Objective:   Physical Exam  Constitutional: She appears well-developed and well-nourished.  HENT:  Right Ear: External ear normal.  Left Ear: External ear normal.  Nose: Nose normal.  Mouth/Throat: Oropharynx is clear and moist. No oropharyngeal  exudate.  Eyes: Conjunctivae are normal.  Neck: Neck supple.  Cardiovascular: Normal rate, regular rhythm and normal heart sounds.   No murmur heard. Pulmonary/Chest: Effort normal and breath sounds normal. No respiratory distress. She has no wheezes. She has no rales. She exhibits no tenderness.  Abdominal: Soft. Bowel sounds are normal. She exhibits no distension. There is no tenderness. There is no rebound and no guarding.  Lymphadenopathy:    She has no cervical adenopathy.  Vitals reviewed.         Assessment & Plan:  Viral syndrome - Plan: oseltamivir (TAMIFLU) 75 MG capsule  Fever, unspecified fever cause - Plan: Influenza a and b  Patient symptoms are consistent with a flulike illness/viral syndrome. I will treat her empirically for the flu with Tamiflu 75 mg by mouth twice a day for 5 days. Also recommended supportive care. Recheck in 48 hours if no better or immediately if worse.

## 2014-10-10 ENCOUNTER — Telehealth: Payer: Self-pay | Admitting: Family Medicine

## 2014-10-10 MED ORDER — VALACYCLOVIR HCL 1 G PO TABS: 2000.0000 mg | ORAL_TABLET | Freq: Two times a day (BID) | ORAL | Status: DC

## 2014-10-10 NOTE — Telephone Encounter (Signed)
Med sent to pharm 

## 2014-10-10 NOTE — Telephone Encounter (Signed)
[facility] [address] [place]  Patient is calling to get refill on her cold sore med, she says she has to have it today if possible because when she starts feeling it if she does not take it, it will defeat the purpose of taking it  [phone]

## 2015-03-03 ENCOUNTER — Telehealth: Payer: Self-pay | Admitting: Family Medicine

## 2015-03-03 ENCOUNTER — Other Ambulatory Visit: Payer: Self-pay | Admitting: Family Medicine

## 2015-03-03 NOTE — Telephone Encounter (Signed)
Patient says that pharmacy sent over med for her cold sores, wants to know if she can have more than one refill on this if possible  [phone]

## 2015-03-03 NOTE — Telephone Encounter (Signed)
Medication refilled per protocol. 

## 2015-03-03 NOTE — Telephone Encounter (Signed)
Anna Jimenez refilled for 5 times

## 2015-04-27 ENCOUNTER — Encounter: Payer: Self-pay | Admitting: Physician Assistant

## 2015-04-27 ENCOUNTER — Ambulatory Visit (INDEPENDENT_AMBULATORY_CARE_PROVIDER_SITE_OTHER): Payer: Managed Care, Other (non HMO) | Admitting: Physician Assistant

## 2015-04-27 VITALS — BP 118/64 | HR 78 | Temp 97.8°F | Resp 14 | Ht 69.5 in | Wt 178.0 lb

## 2015-04-27 DIAGNOSIS — J988 Other specified respiratory disorders: Secondary | ICD-10-CM

## 2015-04-27 DIAGNOSIS — B9689 Other specified bacterial agents as the cause of diseases classified elsewhere: Principal | ICD-10-CM

## 2015-04-27 MED ORDER — AZITHROMYCIN 250 MG PO TABS: ORAL_TABLET | ORAL | Status: DC

## 2015-04-27 NOTE — Progress Notes (Signed)
    Patient ID: Anna Jimenez MRN: [id number], DOB: [birth year], 44 y.o. Date of Encounter: 04/27/2015, 8:20 AM    Chief Complaint:  Chief Complaint  Patient presents with  . Illness    x few weeks- states that she has productive cough with clear nasal drainage- denies fever/ chills, HA, body aches     HPI: 44 y.o. year old white female is that she has been sick for about 2-1/2 weeks. Some runny nose but also a lot of chest congestion and cough. No known fever or chills. No significant sore throat or ear ache.     Home Meds:   Outpatient Prescriptions Prior to Visit  Medication Sig Dispense Refill  . levonorgestrel (MIRENA) 20 MCG/24HR IUD 1 each by Intrauterine route once.    . valACYclovir (VALTREX) 1000 MG tablet TAKE 2 TABLETS BY MOUTH TWICE A DAY 4 tablet 5  . oseltamivir (TAMIFLU) 75 MG capsule Take 1 capsule (75 mg total) by mouth 2 (two) times daily. (Patient not taking: Reported on 04/27/2015) 10 capsule 0   No facility-administered medications prior to visit.    Allergies:  Allergies  Allergen Reactions  . Penicillins Other (See Comments)    Unsure- childhood reaction      Review of Systems: See HPI for pertinent ROS. All other ROS negative.    Physical Exam: Blood pressure 118/64, pulse 78, temperature 97.8 F (36.6 C), temperature source Oral, resp. rate 14, height 5' 9.5" (1.765 m), weight 178 lb (80.74 kg)., Body mass index is 25.92 kg/(m^2). General: WNWD WF.  Appears in no acute distress. HEENT: Normocephalic, atraumatic, eyes without discharge, sclera non-icteric, nares are without discharge. Bilateral auditory canals clear, TM's are without perforation, pearly grey and translucent with reflective cone of light bilaterally. Oral cavity moist, posterior pharynx without exudate, erythema, peritonsillar abscess.  Neck: Supple. No thyromegaly. No lymphadenopathy. Lungs: Clear bilaterally to auscultation without wheezes, rales, or rhonchi. Breathing is  unlabored. Heart: Regular rhythm. No murmurs, rubs, or gallops. Msk:  Strength and tone normal for age. Extremities/Skin: Warm and dry.  Neuro: Alert and oriented X 3. Moves all extremities spontaneously. Gait is normal. CNII-XII grossly in tact. Psych:  Responds to questions appropriately with a normal affect.     ASSESSMENT AND PLAN:  44 y.o. year old female with  1. Bacterial respiratory infection She has penicillin allergy. She is to take antibiotic as directed and complete all of it. Also recommend she use Mucinex DM as expectorant. Follow-up if symptoms do not resolve within 1 week after completion of antibiotic. - azithromycin (ZITHROMAX) 250 MG tablet; Day 1: Take 2 daily.  Days 2-5: Take 1 daily.  Dispense: 6 tablet; Refill: 0   Signed, [address] [place], [place], [facility] 04/27/2015 8:20 AM

## 2015-09-22 ENCOUNTER — Other Ambulatory Visit: Payer: Self-pay | Admitting: Family Medicine

## 2015-09-22 NOTE — Telephone Encounter (Signed)
Refill appropriate and filled per protocol. 

## 2015-10-06 ENCOUNTER — Other Ambulatory Visit: Payer: Self-pay | Admitting: Family Medicine

## 2015-10-06 ENCOUNTER — Other Ambulatory Visit: Payer: Managed Care, Other (non HMO)

## 2015-10-06 DIAGNOSIS — Z79899 Other long term (current) drug therapy: Secondary | ICD-10-CM

## 2015-10-06 DIAGNOSIS — Z0000 Encounter for general adult medical examination without abnormal findings: Secondary | ICD-10-CM

## 2015-10-06 LAB — CBC WITH DIFFERENTIAL/PLATELET
BASOS PCT: 1 % (ref 0–1)
Basophils Absolute: 0.1 10*3/uL (ref 0.0–0.1)
EOS ABS: 0.1 10*3/uL (ref 0.0–0.7)
Eosinophils Relative: 2 % (ref 0–5)
HCT: 44.4 % (ref 36.0–46.0)
Hemoglobin: 15.5 g/dL — ABNORMAL HIGH (ref 12.0–15.0)
Lymphocytes Relative: 36 % (ref 12–46)
Lymphs Abs: 2 10*3/uL (ref 0.7–4.0)
MCH: 33.5 pg (ref 26.0–34.0)
MCHC: 34.9 g/dL (ref 30.0–36.0)
MCV: 96.1 fL (ref 78.0–100.0)
MONOS PCT: 7 % (ref 3–12)
MPV: 9.6 fL (ref 8.6–12.4)
Monocytes Absolute: 0.4 10*3/uL (ref 0.1–1.0)
NEUTROS PCT: 54 % (ref 43–77)
Neutro Abs: 3 10*3/uL (ref 1.7–7.7)
PLATELETS: 166 10*3/uL (ref 150–400)
RBC: 4.62 MIL/uL (ref 3.87–5.11)
RDW: 13 % (ref 11.5–15.5)
WBC: 5.6 10*3/uL (ref 4.0–10.5)

## 2015-10-06 LAB — LIPID PANEL
Cholesterol: 167 mg/dL (ref 125–200)
HDL: 67 mg/dL (ref >=46)
LDL CALC: 84 mg/dL (ref <130)
TRIGLYCERIDES: 82 mg/dL (ref <150)
Total CHOL/HDL Ratio: 2.5 Ratio (ref <=5.0)
VLDL: 16 mg/dL (ref <30)

## 2015-10-06 LAB — COMPLETE METABOLIC PANEL WITH GFR
ALBUMIN: 4.6 g/dL (ref 3.6–5.1)
ALK PHOS: 34 U/L (ref 33–115)
ALT: 17 U/L (ref 6–29)
AST: 20 U/L (ref 10–30)
BILIRUBIN TOTAL: 0.5 mg/dL (ref 0.2–1.2)
BUN: 9 mg/dL (ref 7–25)
CO2: 25 mmol/L (ref 20–31)
CREATININE: 0.96 mg/dL (ref 0.50–1.10)
Calcium: 9.8 mg/dL (ref 8.6–10.2)
Chloride: 105 mmol/L (ref 98–110)
GFR, Est African American: 84 mL/min (ref >=60)
GFR, Est Non African American: 73 mL/min (ref >=60)
GLUCOSE: 90 mg/dL (ref 70–99)
Potassium: 4.9 mmol/L (ref 3.5–5.3)
SODIUM: 139 mmol/L (ref 135–146)
TOTAL PROTEIN: 7 g/dL (ref 6.1–8.1)

## 2015-10-06 LAB — TSH: TSH: 2.17 mIU/L

## 2015-10-09 ENCOUNTER — Ambulatory Visit (INDEPENDENT_AMBULATORY_CARE_PROVIDER_SITE_OTHER): Payer: Managed Care, Other (non HMO) | Admitting: Family Medicine

## 2015-10-09 ENCOUNTER — Encounter: Payer: Self-pay | Admitting: Family Medicine

## 2015-10-09 VITALS — BP 110/76 | HR 86 | Temp 98.2°F | Resp 16 | Ht 69.5 in | Wt 180.0 lb

## 2015-10-09 DIAGNOSIS — Z0000 Encounter for general adult medical examination without abnormal findings: Secondary | ICD-10-CM

## 2015-10-09 DIAGNOSIS — F17200 Nicotine dependence, unspecified, uncomplicated: Secondary | ICD-10-CM | POA: Insufficient documentation

## 2015-10-09 DIAGNOSIS — H6983 Other specified disorders of Eustachian tube, bilateral: Secondary | ICD-10-CM

## 2015-10-09 MED ORDER — CETIRIZINE HCL 10 MG PO TABS: 10.0000 mg | ORAL_TABLET | Freq: Every day | ORAL | Status: DC

## 2015-10-09 MED ORDER — VALACYCLOVIR HCL 1 G PO TABS: 2000.0000 mg | ORAL_TABLET | Freq: Two times a day (BID) | ORAL | Status: DC

## 2015-10-09 MED ORDER — FLUTICASONE PROPIONATE 50 MCG/ACT NA SUSP: 2.0000 | Freq: Every day | NASAL | Status: DC

## 2015-10-09 NOTE — Progress Notes (Signed)
Subjective:    Patient ID: Anna Jimenez, female    DOB: April 09, 1971, 45 y.o.   MRN: [id number]  HPI Here for CPE.the patient sees a gynecologist for her Pap smear, pelvic exam, breast exam, and mammogram. These are all up-to-date. She has no concerns other than her ears feel stopped up. She has a popping sensation in her right ear which sounds like eustachian tube dysfunction. Her left ear feels like she is under water. She is having symptoms for the last 3-4 weeks. She denies any sinus pain or sinus pressure. She denies any sore throat. She does have a lot of nasal congestion. She denies any fever or headaches. She denies any tinnitus. She denies any vertigo. Past Medical History  Diagnosis Date  . Raynaud's disease   . External hemorrhoid    Past Surgical History  Procedure Laterality Date  . Mouth surgery  1999  . Wisdom tooth extraction  90's  . Anal examination under anesthesia  08/12/12    and I&D   Current Outpatient Prescriptions on File Prior to Visit  Medication Sig Dispense Refill  . levonorgestrel (MIRENA) 20 MCG/24HR IUD 1 each by Intrauterine route once.     No current facility-administered medications on file prior to visit.   Allergies  Allergen Reactions  . Penicillins Other (See Comments)    Unsure- childhood reaction   Social History   Social History  . Marital Status: Married    Spouse Name: N/A  . Number of Children: N/A  . Years of Education: N/A   Occupational History  . Not on file.   Social History Main Topics  . Smoking status: Current Some Day Smoker    Types: Cigarettes  . Smokeless tobacco: Never Used  . Alcohol Use: Yes     Comment: Social  . Drug Use: No  . Sexual Activity: Yes   Other Topics Concern  . Not on file   Social History Narrative   Family History  Problem Relation Age of Onset  . Hypertension Mother   . Polymyositis Mother   . Diabetes Maternal Grandfather   . Anesthesia problems Neg Hx   . Hypotension Neg Hx   .  Malignant hyperthermia Neg Hx   . Pseudochol deficiency Neg Hx       Review of Systems  All other systems reviewed and are negative.      Objective:   Physical Exam  Constitutional: She is oriented to person, place, and time. She appears well-developed and well-nourished. No distress.  HENT:  Head: Normocephalic and atraumatic.  Right Ear: External ear normal.  Left Ear: External ear normal.  Nose: Nose normal.  Mouth/Throat: Oropharynx is clear and moist. No oropharyngeal exudate.  Eyes: Conjunctivae and EOM are normal. Pupils are equal, round, and reactive to light. Right eye exhibits no discharge. Left eye exhibits no discharge. No scleral icterus.  Neck: Normal range of motion. Neck supple. No JVD present. No tracheal deviation present. No thyromegaly present.  Cardiovascular: Normal rate, regular rhythm and intact distal pulses.  Exam reveals no gallop and no friction rub.   No murmur heard. Pulmonary/Chest: Effort normal and breath sounds normal. No stridor. No respiratory distress. She has no wheezes. She has no rales. She exhibits no tenderness.  Abdominal: Soft. Bowel sounds are normal. She exhibits no distension and no mass. There is no tenderness. There is no rebound and no guarding.  Musculoskeletal: Normal range of motion. She exhibits no edema or tenderness.  Lymphadenopathy:  She has no cervical adenopathy.  Neurological: She is alert and oriented to person, place, and time. She has normal reflexes. She displays normal reflexes. No cranial nerve deficit. She exhibits normal muscle tone. Coordination normal.  Skin: Skin is warm. No rash noted. She is not diaphoretic. No erythema. No pallor.  Psychiatric: She has a normal mood and affect. Her behavior is normal. Judgment and thought content normal.  Vitals reviewed.   Appointment on 10/06/2015  Component Date Value Ref Range Status  . Sodium 10/06/2015 139  135 - 146 mmol/L Final  . Potassium 10/06/2015 4.9  3.5 -  5.3 mmol/L Final  . Chloride 10/06/2015 105  98 - 110 mmol/L Final  . CO2 10/06/2015 25  20 - 31 mmol/L Final  . Glucose, Bld 10/06/2015 90  70 - 99 mg/dL Final  . BUN 10/06/2015 9  7 - 25 mg/dL Final  . Creat 10/06/2015 0.96  0.50 - 1.10 mg/dL Final  . Total Bilirubin 10/06/2015 0.5  0.2 - 1.2 mg/dL Final  . Alkaline Phosphatase 10/06/2015 34  33 - 115 U/L Final  . AST 10/06/2015 20  10 - 30 U/L Final  . ALT 10/06/2015 17  6 - 29 U/L Final  . Total Protein 10/06/2015 7.0  6.1 - 8.1 g/dL Final  . Albumin 10/06/2015 4.6  3.6 - 5.1 g/dL Final  . Calcium 10/06/2015 9.8  8.6 - 10.2 mg/dL Final  . GFR, Est African American 10/06/2015 84  >=60 mL/min Final  . GFR, Est Non African American 10/06/2015 73  >=60 mL/min Final   Comment:   The estimated GFR is a calculation valid for adults (>=18 years old) that uses the CKD-EPI algorithm to adjust for age and sex. It is   not to be used for children, pregnant women, hospitalized patients,    patients on dialysis, or with rapidly changing kidney function. According to the NKDEP, eGFR >89 is normal, 60-89 shows mild impairment, 30-59 shows moderate impairment, 15-29 shows severe impairment and <15 is ESRD.     . TSH 10/06/2015 2.17   Final   Comment:   Reference Range   > or = 20 Years  0.40-4.50   Pregnancy Range First trimester  0.26-2.66 Second trimester 0.55-2.73 Third trimester  0.43-2.91     . Cholesterol 10/06/2015 167  125 - 200 mg/dL Final  . Triglycerides 10/06/2015 82  <150 mg/dL Final  . HDL 10/06/2015 67  >=46 mg/dL Final  . Total CHOL/HDL Ratio 10/06/2015 2.5  <=5.0 Ratio Final  . VLDL 10/06/2015 16  <30 mg/dL Final  . LDL Cholesterol 10/06/2015 84  <130 mg/dL Final   Comment:   Total Cholesterol/HDL Ratio:CHD Risk                        Coronary Heart Disease Risk Table                                        Men       Women          1/2 Average Risk              3.4        3.3              Average Risk               5.0  4.4           2X Average Risk              9.6        7.1           3X Average Risk             23.4       11.0 Use the calculated Patient Ratio above and the CHD Risk table  to determine the patient's CHD Risk.   . WBC 10/06/2015 5.6  4.0 - 10.5 K/uL Final  . RBC 10/06/2015 4.62  3.87 - 5.11 MIL/uL Final  . Hemoglobin 10/06/2015 15.5* 12.0 - 15.0 g/dL Final  . HCT 10/06/2015 44.4  36.0 - 46.0 % Final  . MCV 10/06/2015 96.1  78.0 - 100.0 fL Final  . MCH 10/06/2015 33.5  26.0 - 34.0 pg Final  . MCHC 10/06/2015 34.9  30.0 - 36.0 g/dL Final  . RDW 10/06/2015 13.0  11.5 - 15.5 % Final  . Platelets 10/06/2015 166  150 - 400 K/uL Final  . MPV 10/06/2015 9.6  8.6 - 12.4 fL Final  . Neutrophils Relative % 10/06/2015 54  43 - 77 % Final  . Neutro Abs 10/06/2015 3.0  1.7 - 7.7 K/uL Final  . Lymphocytes Relative 10/06/2015 36  12 - 46 % Final  . Lymphs Abs 10/06/2015 2.0  0.7 - 4.0 K/uL Final  . Monocytes Relative 10/06/2015 7  3 - 12 % Final  . Monocytes Absolute 10/06/2015 0.4  0.1 - 1.0 K/uL Final  . Eosinophils Relative 10/06/2015 2  0 - 5 % Final  . Eosinophils Absolute 10/06/2015 0.1  0.0 - 0.7 K/uL Final  . Basophils Relative 10/06/2015 1  0 - 1 % Final  . Basophils Absolute 10/06/2015 0.1  0.0 - 0.1 K/uL Final  . Smear Review 10/06/2015 Criteria for review not met   Final         Assessment & Plan:  Routine general medical examination at a health care facility  Eustachian tube dysfunction, bilateral - Plan: fluticasone (FLONASE) 50 MCG/ACT nasal spray, cetirizine (ZYRTEC) 10 MG tablet  I recommended smoking cessation to the patient. She is already exercising regularly. Her lab work is excellent. Her cancer screening is provided by her gynecologist. I recommended a flu shot but she politely declined. Her symptoms sound like eustachian tube dysfunction. There are no visible abnormalities with the tympanic membrane or the external auditory canal. Therefore I will try  commendation of Flonase and Zyrtec along with eustachian tube exercises for a period of 714 days. If symptoms have not improved at that point, I would recommend a referral to an ear nose and throat physician

## 2015-10-18 ENCOUNTER — Other Ambulatory Visit: Payer: Self-pay | Admitting: Family Medicine

## 2016-06-06 ENCOUNTER — Encounter: Payer: Self-pay | Admitting: Family Medicine

## 2016-06-14 ENCOUNTER — Ambulatory Visit (INDEPENDENT_AMBULATORY_CARE_PROVIDER_SITE_OTHER): Payer: Managed Care, Other (non HMO) | Admitting: Family Medicine

## 2016-06-14 ENCOUNTER — Encounter: Payer: Self-pay | Admitting: Family Medicine

## 2016-06-14 VITALS — BP 110/76 | HR 68 | Temp 97.6°F | Resp 16 | Ht 69.5 in | Wt 182.0 lb

## 2016-06-14 DIAGNOSIS — H6983 Other specified disorders of Eustachian tube, bilateral: Secondary | ICD-10-CM | POA: Diagnosis not present

## 2016-06-14 DIAGNOSIS — H9012 Conductive hearing loss, unilateral, left ear, with unrestricted hearing on the contralateral side: Secondary | ICD-10-CM

## 2016-06-14 MED ORDER — CETIRIZINE HCL 10 MG PO TABS: 10.0000 mg | ORAL_TABLET | Freq: Every day | ORAL | 11 refills | Status: DC

## 2016-06-14 MED ORDER — FLUTICASONE PROPIONATE 50 MCG/ACT NA SUSP: 2.0000 | Freq: Every day | NASAL | 6 refills | Status: DC

## 2016-06-14 MED ORDER — VALACYCLOVIR HCL 1 G PO TABS: 2000.0000 mg | ORAL_TABLET | Freq: Two times a day (BID) | ORAL | 3 refills | Status: DC

## 2016-06-14 NOTE — Progress Notes (Signed)
   Subjective:    Patient ID: Anna Jimenez, female    DOB: 05/26/1971, 45 y.o.   MRN: [id number]  HPI  Patient is here today for asymmetric hearing loss in her left ear. Recently had a hearing test performed at [facility]. There was no hearing loss in her right ear. In her left ear there was significant low frequency hearing loss. Between 501,000 Hz, patient had hearing loss to 65 dB. This was with air conduction. With bone conduction however her hearing loss was only to 25 dB. On examination today, she appears to have a middle ear effusion. There is loss of bony landmarks on examination. The eardrum appears to be bulging out. I diagnosed the patient with eustachian tube dysfunction back in March. She took allergy medicine for a week and then stop the medication when her symptoms improved. She has not been on Zyrtec or Flonase since. Past Medical History:  Diagnosis Date  . External hemorrhoid   . Raynaud's disease   . Smoker    Past Surgical History:  Procedure Laterality Date  . ANAL EXAMINATION UNDER ANESTHESIA  08/12/12   and I&D  . MOUTH SURGERY  1999  . WISDOM TOOTH EXTRACTION  90's   Current Outpatient Prescriptions on File Prior to Visit  Medication Sig Dispense Refill  . levonorgestrel (MIRENA) 20 MCG/24HR IUD 1 each by Intrauterine route once.     No current facility-administered medications on file prior to visit.    Allergies  Allergen Reactions  . Penicillins Other (See Comments)    Unsure- childhood reaction   Social History   Social History  . Marital status: Married    Spouse name: N/A  . Number of children: N/A  . Years of education: N/A   Occupational History  . Not on file.   Social History Main Topics  . Smoking status: Current Some Day Smoker    Types: Cigarettes  . Smokeless tobacco: Never Used  . Alcohol use Yes     Comment: Social  . Drug use: No  . Sexual activity: Yes   Other Topics Concern  . Not on file   Social History Narrative  . No  narrative on file     Review of Systems  All other systems reviewed and are negative.      Objective:   Physical Exam  HENT:  Right Ear: Tympanic membrane and ear canal normal.  Left Ear: Tympanic membrane is bulging. Tympanic membrane is not injected, not scarred, not perforated, not erythematous and not retracted. Tympanic membrane mobility is abnormal. A middle ear effusion is present. Decreased hearing is noted.  Cardiovascular: Normal rate, regular rhythm and normal heart sounds.   Pulmonary/Chest: Effort normal and breath sounds normal.  Vitals reviewed.         Assessment & Plan:  Conductive hearing loss of left ear, unspecified hearing status on contralateral side - Plan: Ambulatory referral to ENT  Eustachian tube dysfunction, bilateral - Plan: cetirizine (ZYRTEC) 10 MG tablet, fluticasone (FLONASE) 50 MCG/ACT nasal spray, Ambulatory referral to ENT  I believe the patient has conductive hearing loss in the left ear secondary to a middle ear effusion due to eustachian tube dysfunction. I will start her on Flonase 2 sprays each nostril daily and Zyrtec 10 mg a day. I will consult ENT. If symptoms are not improving, I would like the patient to get a second opinion. She may benefit from tympanostomy tube placement.

## 2016-06-24 ENCOUNTER — Encounter: Payer: Self-pay | Admitting: Family Medicine

## 2016-06-24 ENCOUNTER — Ambulatory Visit (INDEPENDENT_AMBULATORY_CARE_PROVIDER_SITE_OTHER): Payer: Managed Care, Other (non HMO) | Admitting: Family Medicine

## 2016-06-24 VITALS — BP 122/60 | HR 70 | Temp 98.4°F | Resp 14 | Ht 69.5 in | Wt 179.0 lb

## 2016-06-24 DIAGNOSIS — L0201 Cutaneous abscess of face: Secondary | ICD-10-CM | POA: Diagnosis not present

## 2016-06-24 MED ORDER — SULFAMETHOXAZOLE-TRIMETHOPRIM 800-160 MG PO TABS: 1.0000 | ORAL_TABLET | Freq: Two times a day (BID) | ORAL | 0 refills | Status: DC

## 2016-06-24 NOTE — Patient Instructions (Signed)
Take antibiotics as prescribed Call if not improved

## 2016-06-24 NOTE — Progress Notes (Signed)
   Subjective:    Patient ID: Anna Jimenez, female    DOB: 07/27/1971, 45 y.o.   MRN: [id number]  Patient presents for Knot to R Side of Face (x2 days- reports swelling to face and cheek and squishy knot to upper maxilla on R side- denies pain)  Patient here with swelling to her right lower cheek. She's had recurrent abscesses before get some on her shoulders or back this when came up on Saturday morning it is tender to touch she's not had any drainage she's not had any fever no recent illnesses no dental issues she has not used any new makeup or facial wash.   Review Of Systems:  GEN- denies fatigue, fever, weight loss,weakness, recent illness HEENT- denies eye drainage, change in vision, nasal discharge, CVS- denies chest pain, palpitations RESP- denies SOB, cough, wheeze ABD- denies N/V, change in stools, abd pain GU- denies dysuria, hematuria, dribbling, incontinence MSK- denies joint pain, muscle aches, injury Neuro- denies headache, dizziness, syncope, seizure activity       Objective:    BP 122/60 (BP Location: Left Arm, Patient Position: Sitting, Cuff Size: Large)   Pulse 70   Temp 98.4 F (36.9 C) (Oral)   Resp 14   Ht 5' 9.5" (1.765 m)   Wt 179 lb (81.2 kg)   SpO2 98%   BMI 26.05 kg/m  GEN- NAD, alert and oriented x3 HEENT- PERRL, EOMI, non injected sclera, pink conjunctiva, MMM, oropharynx clear Neck- Supple, shotty right submandibular LAD Skin- right lower cheek- dime size induration, non fluctanct ,+erythema, TTP          Assessment & Plan:      Problem List Items Addressed This Visit    None    Visit Diagnoses    Abscess of face    -  Primary   Concern that she has small abscess on her face though possible infected cyst. We'll treat with warm compresses Bactrim if this does not go down get her to dermatology to have this opened up       Note: This dictation was prepared with Dragon dictation along with smaller phrase technology. Any  transcriptional errors that result from this process are unintentional.

## 2016-07-22 ENCOUNTER — Other Ambulatory Visit: Payer: Self-pay | Admitting: Otolaryngology

## 2016-07-23 ENCOUNTER — Other Ambulatory Visit: Payer: Self-pay | Admitting: Otolaryngology

## 2016-07-23 DIAGNOSIS — H9072 Mixed conductive and sensorineural hearing loss, unilateral, left ear, with unrestricted hearing on the contralateral side: Secondary | ICD-10-CM

## 2016-08-06 ENCOUNTER — Ambulatory Visit
Admission: RE | Admit: 2016-08-06 | Discharge: 2016-08-06 | Disposition: A | Discharge status: Home / Self Care | Payer: 59 | Source: Ambulatory Visit | Attending: Otolaryngology | Admitting: Otolaryngology

## 2016-08-06 DIAGNOSIS — H9072 Mixed conductive and sensorineural hearing loss, unilateral, left ear, with unrestricted hearing on the contralateral side: Secondary | ICD-10-CM

## 2016-08-06 MED ORDER — GADOBENATE DIMEGLUMINE 529 MG/ML IV SOLN
15.0000 mL | Freq: Once | INTRAVENOUS | Status: AC | PRN
  Administered 2016-08-06: 15 mL via INTRAVENOUS

## 2016-08-09 ENCOUNTER — Other Ambulatory Visit: Payer: Self-pay

## 2016-08-29 ENCOUNTER — Other Ambulatory Visit: Payer: Self-pay | Admitting: Family Medicine

## 2017-05-27 ENCOUNTER — Other Ambulatory Visit: Payer: Self-pay | Admitting: Family Medicine

## 2017-09-25 ENCOUNTER — Other Ambulatory Visit: Payer: Self-pay | Admitting: Family Medicine

## 2017-09-25 DIAGNOSIS — H6993 Unspecified Eustachian tube disorder, bilateral: Secondary | ICD-10-CM

## 2017-09-25 DIAGNOSIS — H6983 Other specified disorders of Eustachian tube, bilateral: Secondary | ICD-10-CM

## 2017-10-06 ENCOUNTER — Ambulatory Visit (INDEPENDENT_AMBULATORY_CARE_PROVIDER_SITE_OTHER): Payer: Managed Care, Other (non HMO) | Admitting: Family Medicine

## 2017-10-06 ENCOUNTER — Encounter: Payer: Self-pay | Admitting: Family Medicine

## 2017-10-06 VITALS — BP 110/74 | HR 66 | Temp 97.9°F | Resp 12 | Ht 69.5 in | Wt 173.0 lb

## 2017-10-06 DIAGNOSIS — Z0000 Encounter for general adult medical examination without abnormal findings: Secondary | ICD-10-CM | POA: Diagnosis not present

## 2017-10-06 DIAGNOSIS — F17200 Nicotine dependence, unspecified, uncomplicated: Secondary | ICD-10-CM | POA: Diagnosis not present

## 2017-10-06 LAB — CBC WITH DIFFERENTIAL/PLATELET
BASOS ABS: 39 {cells}/uL (ref 0–200)
Basophils Relative: 0.9 %
EOS PCT: 2.3 %
Eosinophils Absolute: 99 cells/uL (ref 15–500)
HEMATOCRIT: 44.7 % (ref 35.0–45.0)
HEMOGLOBIN: 15.4 g/dL (ref 11.7–15.5)
LYMPHS ABS: 2090 {cells}/uL (ref 850–3900)
MCH: 33.1 pg — ABNORMAL HIGH (ref 27.0–33.0)
MCHC: 34.5 g/dL (ref 32.0–36.0)
MCV: 96.1 fL (ref 80.0–100.0)
MPV: 10.4 fL (ref 7.5–12.5)
Monocytes Relative: 6.9 %
NEUTROS ABS: 1776 {cells}/uL (ref 1500–7800)
Neutrophils Relative %: 41.3 %
Platelets: 200 10*3/uL (ref 140–400)
RBC: 4.65 10*6/uL (ref 3.80–5.10)
RDW: 11.9 % (ref 11.0–15.0)
Total Lymphocyte: 48.6 %
WBC mixed population: 297 cells/uL (ref 200–950)
WBC: 4.3 10*3/uL (ref 3.8–10.8)

## 2017-10-06 LAB — COMPLETE METABOLIC PANEL WITH GFR
AG Ratio: 2 (calc) (ref 1.0–2.5)
ALBUMIN MSPROF: 4.5 g/dL (ref 3.6–5.1)
ALKALINE PHOSPHATASE (APISO): 36 U/L (ref 33–115)
ALT: 16 U/L (ref 6–29)
AST: 24 U/L (ref 10–35)
BUN/Creatinine Ratio: 6 (calc) (ref 6–22)
BUN: 6 mg/dL — AB (ref 7–25)
CO2: 27 mmol/L (ref 20–32)
CREATININE: 0.97 mg/dL (ref 0.50–1.10)
Calcium: 9.3 mg/dL (ref 8.6–10.2)
Chloride: 105 mmol/L (ref 98–110)
GFR, EST NON AFRICAN AMERICAN: 71 mL/min/{1.73_m2} (ref >=60)
GFR, Est African American: 82 mL/min/{1.73_m2} (ref >=60)
GLUCOSE: 69 mg/dL (ref 65–99)
Globulin: 2.2 g/dL (calc) (ref 1.9–3.7)
Potassium: 4.7 mmol/L (ref 3.5–5.3)
Sodium: 140 mmol/L (ref 135–146)
Total Bilirubin: 0.6 mg/dL (ref 0.2–1.2)
Total Protein: 6.7 g/dL (ref 6.1–8.1)

## 2017-10-06 LAB — LIPID PANEL
CHOL/HDL RATIO: 2.8 (calc) (ref <5.0)
CHOLESTEROL: 155 mg/dL (ref <200)
HDL: 55 mg/dL (ref >50)
LDL CHOLESTEROL (CALC): 77 mg/dL
NON-HDL CHOLESTEROL (CALC): 100 mg/dL (ref <130)
Triglycerides: 125 mg/dL (ref <150)

## 2017-10-06 NOTE — Progress Notes (Signed)
Subjective:    Patient ID: Anna Jimenez, female    DOB: 04-12-1971, 47 y.o.   MRN: [id number]  HPI The patient is a very pleasant 47 year old Caucasian female here today for a complete physical exam.  She exercises every day for at least 30 minutes.  Unfortunately she continues to smoke but she only smokes 5-6 cigarettes every evening.  The patient sees a gynecologist for her Pap smear, pelvic exam, breast exam, and mammogram. These are all up-to-date.  She declines a flu shot today Past Medical History:  Diagnosis Date  . External hemorrhoid   . Raynaud's disease   . Smoker    Past Surgical History:  Procedure Laterality Date  . ANAL EXAMINATION UNDER ANESTHESIA  08/12/12   and I&D  . MOUTH SURGERY  1999  . WISDOM TOOTH EXTRACTION  90's   Current Outpatient Medications on File Prior to Visit  Medication Sig Dispense Refill  . cetirizine (ZYRTEC) 10 MG tablet Take 1 tablet (10 mg total) by mouth daily. Requires office visit before any further refills can be given. 30 tablet 0  . fluticasone (FLONASE) 50 MCG/ACT nasal spray Place 2 sprays into both nostrils daily. 16 g 6  . levonorgestrel (MIRENA) 20 MCG/24HR IUD 1 each by Intrauterine route once.    . valACYclovir (VALTREX) 1000 MG tablet TAKE 2 TABLETS (2,000 MG TOTAL) BY MOUTH 2 (TWO) TIMES DAILY. 4 tablet 3   No current facility-administered medications on file prior to visit.    Allergies  Allergen Reactions  . Penicillins Other (See Comments)    Unsure- childhood reaction Unsure- childhood reaction   Social History   Socioeconomic History  . Marital status: Married    Spouse name: Not on file  . Number of children: Not on file  . Years of education: Not on file  . Highest education level: Not on file  Social Needs  . Financial resource strain: Not on file  . Food insecurity - worry: Not on file  . Food insecurity - inability: Not on file  . Transportation needs - medical: Not on file  . Transportation needs -  non-medical: Not on file  Occupational History  . Not on file  Tobacco Use  . Smoking status: Current Some Day Smoker    Types: Cigarettes  . Smokeless tobacco: Never Used  Substance and Sexual Activity  . Alcohol use: Yes    Comment: Social  . Drug use: No  . Sexual activity: Yes  Other Topics Concern  . Not on file  Social History Narrative  . Not on file   Family History  Problem Relation Age of Onset  . Hypertension Mother   . Polymyositis Mother   . Diabetes Maternal Grandfather   . Anesthesia problems Neg Hx   . Hypotension Neg Hx   . Malignant hyperthermia Neg Hx   . Pseudochol deficiency Neg Hx       Review of Systems  All other systems reviewed and are negative.      Objective:   Physical Exam  Constitutional: She is oriented to person, place, and time. She appears well-developed and well-nourished. No distress.  HENT:  Head: Normocephalic and atraumatic.  Right Ear: External ear normal.  Left Ear: External ear normal.  Nose: Nose normal.  Mouth/Throat: Oropharynx is clear and moist. No oropharyngeal exudate.  Eyes: Conjunctivae and EOM are normal. Pupils are equal, round, and reactive to light. Right eye exhibits no discharge. Left eye exhibits no discharge. No scleral icterus.  Neck: Normal range of motion. Neck supple. No JVD present. No tracheal deviation present. No thyromegaly present.  Cardiovascular: Normal rate, regular rhythm and intact distal pulses. Exam reveals no gallop and no friction rub.  No murmur heard. Pulmonary/Chest: Effort normal and breath sounds normal. No stridor. No respiratory distress. She has no wheezes. She has no rales. She exhibits no tenderness.  Abdominal: Soft. Bowel sounds are normal. She exhibits no distension and no mass. There is no tenderness. There is no rebound and no guarding.  Musculoskeletal: Normal range of motion. She exhibits no edema or tenderness.  Lymphadenopathy:    She has no cervical adenopathy.    Neurological: She is alert and oriented to person, place, and time. She has normal reflexes. No cranial nerve deficit. She exhibits normal muscle tone. Coordination normal.  Skin: Skin is warm. No rash noted. She is not diaphoretic. No erythema. No pallor.  Psychiatric: She has a normal mood and affect. Her behavior is normal. Judgment and thought content normal.  Vitals reviewed.        Assessment & Plan:  Routine general medical examination at a health care facility - Plan: CBC with Differential/Platelet, COMPLETE METABOLIC PANEL WITH GFR, Lipid panel  Smoker  I recommended smoking cessation to the patient. She is already exercising regularly.  Check CBC, CMP, fasting lipid panel.  Recommended a flu shot but the patient politely declined.  Cancer screening is performed at her gynecologist.  She is not yet due for a colonoscopy.

## 2017-10-08 ENCOUNTER — Encounter: Payer: Self-pay | Admitting: Family Medicine

## 2017-11-06 ENCOUNTER — Other Ambulatory Visit: Payer: Self-pay | Admitting: Family Medicine

## 2017-11-06 DIAGNOSIS — H6993 Unspecified Eustachian tube disorder, bilateral: Secondary | ICD-10-CM

## 2017-11-06 DIAGNOSIS — H6983 Other specified disorders of Eustachian tube, bilateral: Secondary | ICD-10-CM

## 2018-04-02 ENCOUNTER — Other Ambulatory Visit: Payer: Self-pay | Admitting: Family Medicine

## 2018-07-23 IMAGING — MR MR HEAD WO/W CM
11 series · 30 of 48 positions shown · IV contrast (multihance)
Comparison: None.

CLINICAL DATA: Mix conductive   sensorineural hearing loss left ear

EXAM:
MRI HEAD WITHOUT AND WITH CONTRAST
TECHNIQUE: Multiplanar, multiecho pulse sequences of the brain and surrounding
structures were obtained without and with intravenous contrast.
CONTRAST:  15mL MULTIHANCE GADOBENATE DIMEGLUMINE 529 MG/ML IV SOLN

[Series 2: T1 · sagittal · 5.0mm · 0.45mm/px · 3 of 21 slices shown (1 of 4)]
[im 1/21]
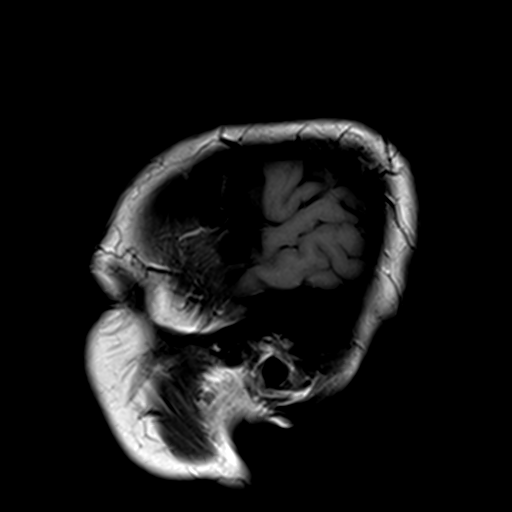
[im 11/21]
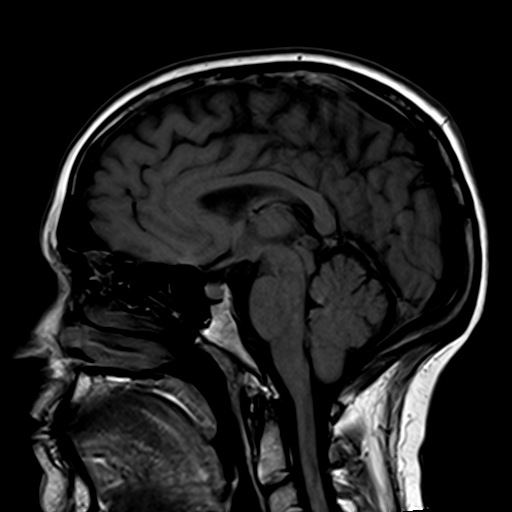
[im 21/21]
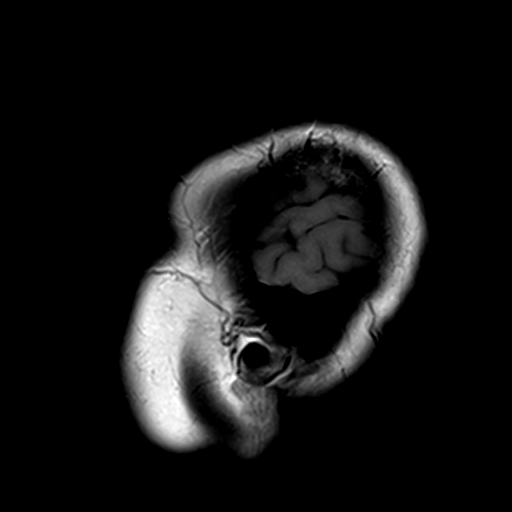

[Series 3: ep2d_diff_(id)_trace · axial · 3.0mm · 1.80mm/px · z∈[-64,+82]mm · 8 of 100 slices shown]
[im 1/100]
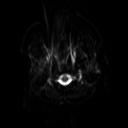
[im 19/100]
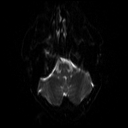
[im 28/100]
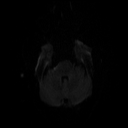
[im 46/100]
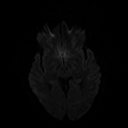
[im 55/100]
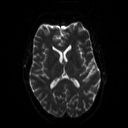
[im 73/100]
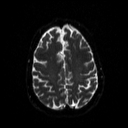
[im 82/100]
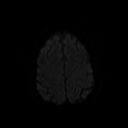
[im 100/100]
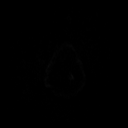

[Series 4: ep2d_diff_(id)_trace_adc · axial · 3.0mm · 1.80mm/px · z∈[-64,+82]mm · 5 of 47 slices shown]
[im 1/47]
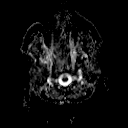
[im 12/47]
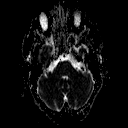
[im 24/47]
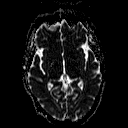
[im 35/47]
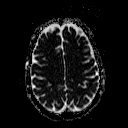
[im 47/47]
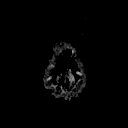

[Series 5: T2 · axial · 5.0mm · 0.45mm/px · z∈[-57,+79]mm · 2 of 23 slices shown]
[im 1/23]
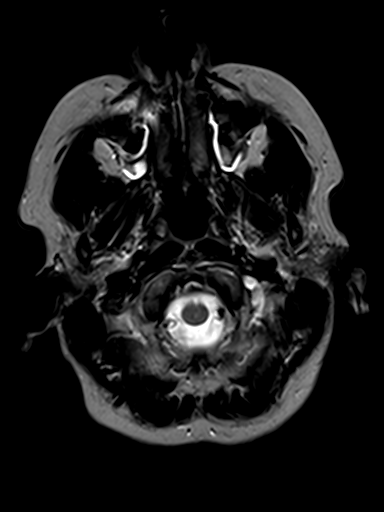
[im 23/23]
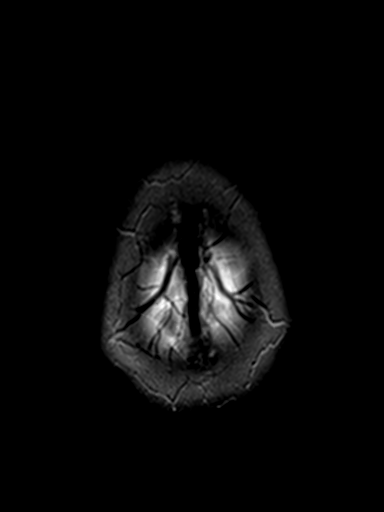

[Series 6: FLAIR · axial · 5.0mm · 0.45mm/px · z∈[-59,+77]mm · 2 of 23 slices shown]
[im 1/23]
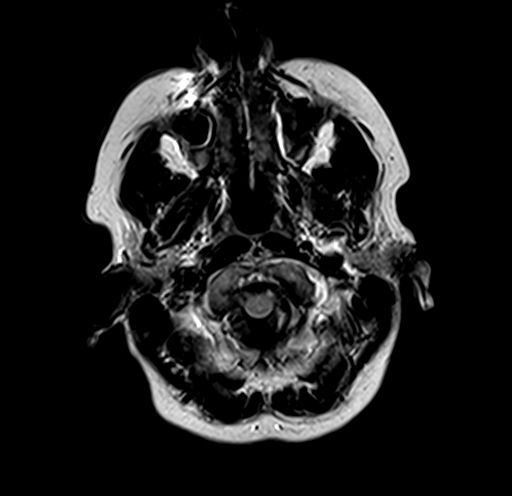
[im 23/23]
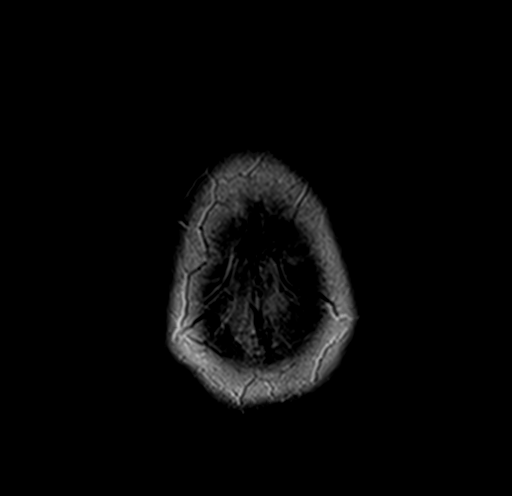

[Series 7: T1 · coronal · 3.0mm · 0.35mm/px · 1 of 11 slices shown (2 of 4)]
[im 1/11]
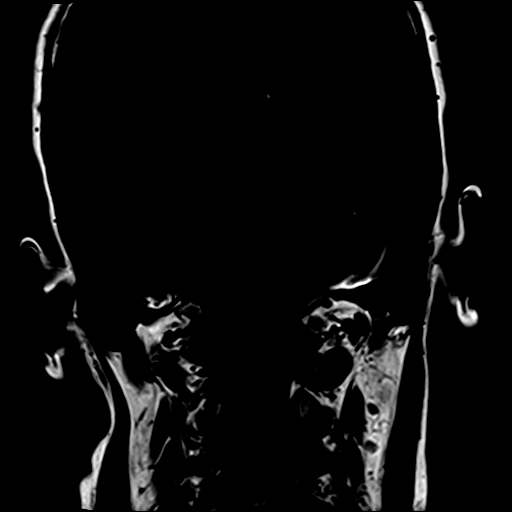

[Series 8: T1 · axial · 3.0mm · 0.35mm/px · 1 of 11 slices shown (3 of 4)]
[im 1/11]
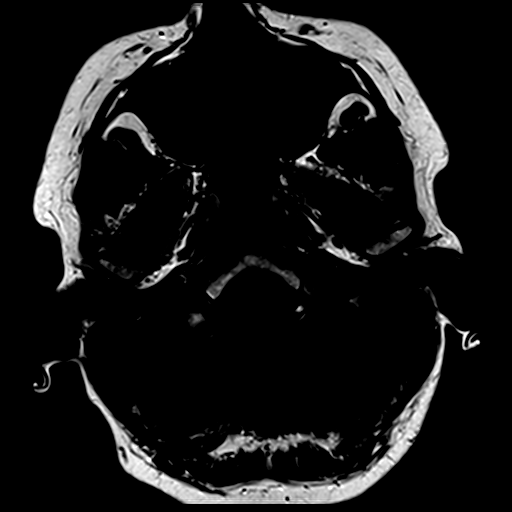

[Series 9: bSSFP · axial · 0.7mm · 0.28mm/px · z∈[-51,-21]mm · 5 of 44 slices shown]
[im 1/44]
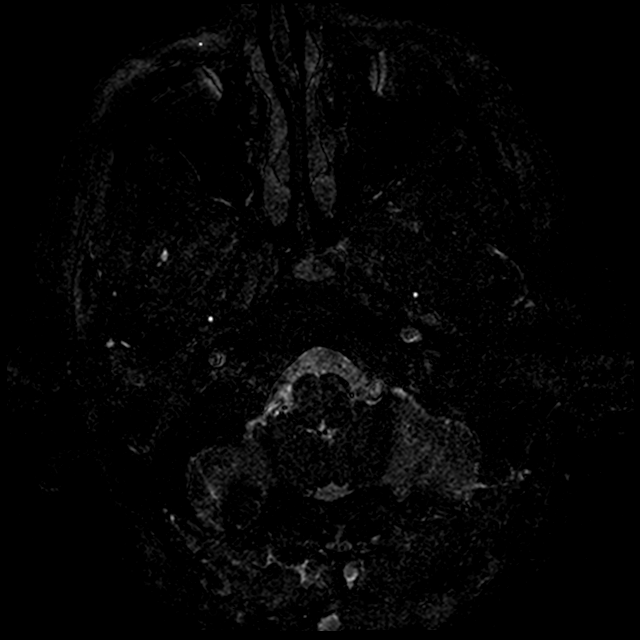
[im 11/44]
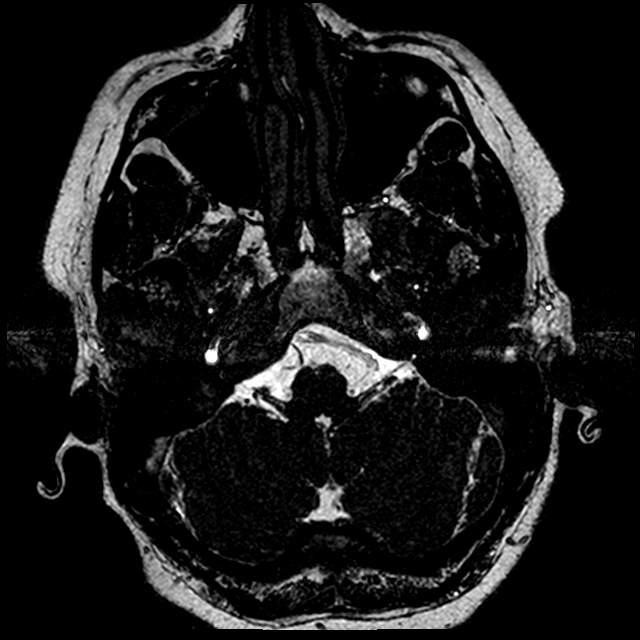
[im 22/44]
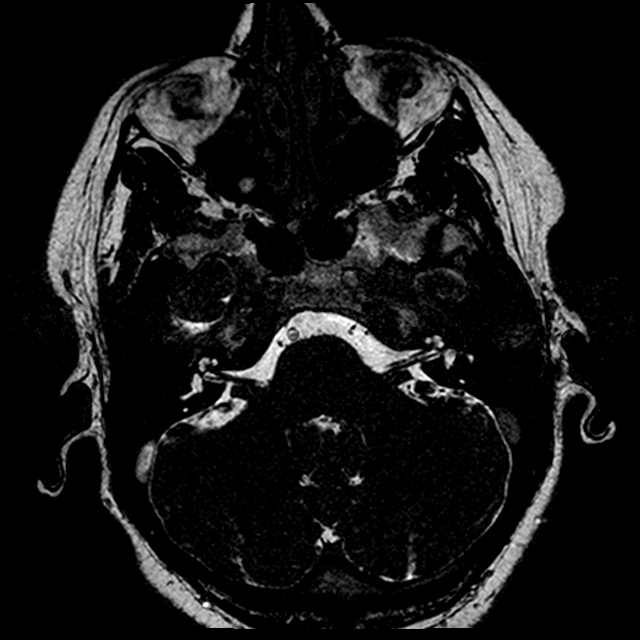
[im 33/44]
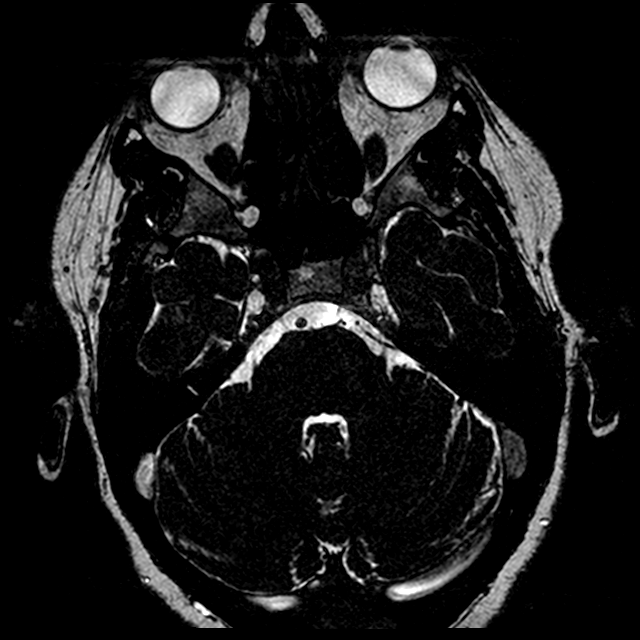
[im 44/44]
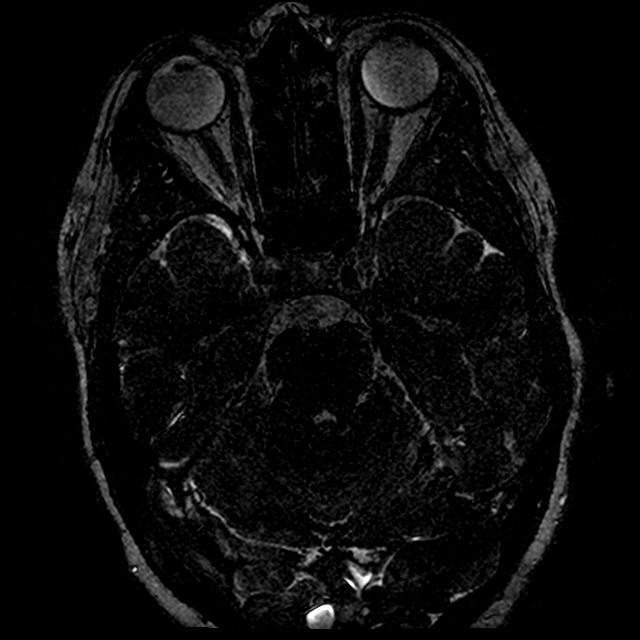

[Series 10: T1 · coronal · 3.0mm · 0.35mm/px · 1 of 11 slices shown (4 of 4)]
[im 1/11]
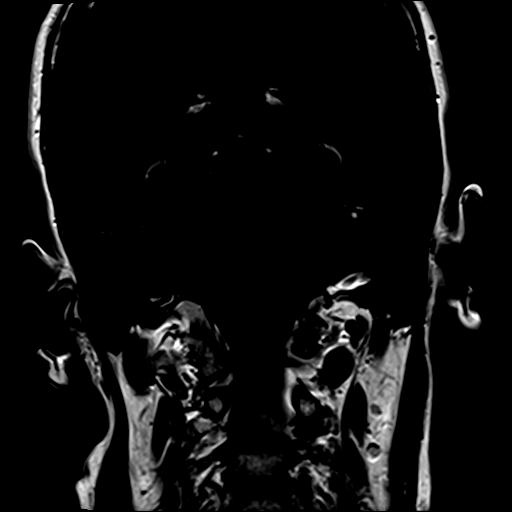

[Series 11: T1 post-contrast · axial · 3.0mm · 0.35mm/px · 1 of 11 slices shown]
[im 1/11]
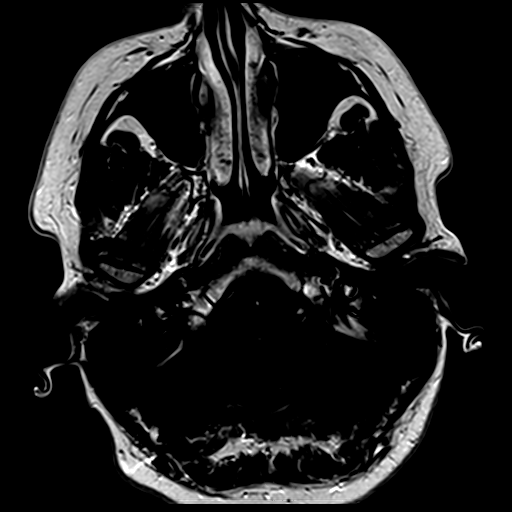

[Series 12: post t1_mpr_tra · axial · 1.0mm · 0.72mm/px · 1 of 144 slices shown]
[im 1/144]
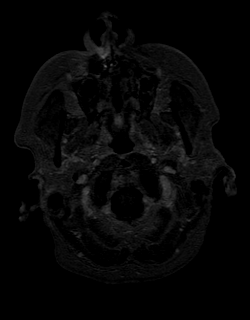

[30 of 48 positions shown; findings below may reference images not displayed]

FINDINGS: Brain: IAC protocol was performed including thin section imaging
through the posterior fossa before and after intravenous contrast.

Seventh and eighth cranial nerves normal. Negative for vestibular
schwannoma. Brainstem and cerebellum normal. Mastoid sinus clear
bilaterally. Basilar cisterns normal and symmetric.

Ventricle size normal. Negative for acute infarct. Subcentimeter
periventricular white matter hyperintensity in the right parietal
lobe and several small scattered white matter hyperintensities in
the frontal lobes. Negative for intracranial mass lesion.

Vascular: Normal arterial flow voids.

Skull and upper cervical spine: Negative

Sinuses/Orbits: Mild mucosal edema in the paranasal sinuses. No
air-fluid level. Normal orbit.

Other: None
IMPRESSION: No cause for hearing loss identified.

Mucosal edema in the paranasal sinuses. Mastoid sinus clear
bilaterally

Nonspecific white matter lesions bilaterally. Differential diagnosis
includes chronic microvascular ischemia, headache migraine headache,
and demyelinating disease.

## 2018-08-19 ENCOUNTER — Other Ambulatory Visit: Payer: Self-pay | Admitting: Family Medicine

## 2018-10-12 ENCOUNTER — Encounter: Payer: Self-pay | Admitting: Family Medicine

## 2018-10-12 ENCOUNTER — Ambulatory Visit (INDEPENDENT_AMBULATORY_CARE_PROVIDER_SITE_OTHER): Payer: 59 | Admitting: Family Medicine

## 2018-10-12 VITALS — BP 120/76 | HR 76 | Temp 98.1°F | Resp 14 | Ht 69.5 in | Wt 166.0 lb

## 2018-10-12 DIAGNOSIS — Z0000 Encounter for general adult medical examination without abnormal findings: Secondary | ICD-10-CM | POA: Diagnosis not present

## 2018-10-12 DIAGNOSIS — F17200 Nicotine dependence, unspecified, uncomplicated: Secondary | ICD-10-CM | POA: Diagnosis not present

## 2018-10-12 LAB — CBC WITH DIFFERENTIAL/PLATELET
ABSOLUTE MONOCYTES: 317 {cells}/uL (ref 200–950)
BASOS ABS: 40 {cells}/uL (ref 0–200)
Basophils Relative: 0.9 %
Eosinophils Absolute: 70 cells/uL (ref 15–500)
Eosinophils Relative: 1.6 %
HCT: 44.7 % (ref 35.0–45.0)
Hemoglobin: 15.1 g/dL (ref 11.7–15.5)
Lymphs Abs: 2094 cells/uL (ref 850–3900)
MCH: 33 pg (ref 27.0–33.0)
MCHC: 33.8 g/dL (ref 32.0–36.0)
MCV: 97.8 fL (ref 80.0–100.0)
MPV: 10.3 fL (ref 7.5–12.5)
Monocytes Relative: 7.2 %
NEUTROS PCT: 42.7 %
Neutro Abs: 1879 cells/uL (ref 1500–7800)
PLATELETS: 183 10*3/uL (ref 140–400)
RBC: 4.57 10*6/uL (ref 3.80–5.10)
RDW: 12.2 % (ref 11.0–15.0)
TOTAL LYMPHOCYTE: 47.6 %
WBC: 4.4 10*3/uL (ref 3.8–10.8)

## 2018-10-12 LAB — COMPLETE METABOLIC PANEL WITH GFR
AG RATIO: 1.8 (calc) (ref 1.0–2.5)
ALKALINE PHOSPHATASE (APISO): 38 U/L (ref 31–125)
ALT: 15 U/L (ref 6–29)
AST: 20 U/L (ref 10–35)
Albumin: 4.4 g/dL (ref 3.6–5.1)
BILIRUBIN TOTAL: 0.6 mg/dL (ref 0.2–1.2)
BUN: 7 mg/dL (ref 7–25)
CALCIUM: 9.8 mg/dL (ref 8.6–10.2)
CHLORIDE: 105 mmol/L (ref 98–110)
CO2: 26 mmol/L (ref 20–32)
Creat: 0.9 mg/dL (ref 0.50–1.10)
GFR, Est African American: 89 mL/min/{1.73_m2} (ref >=60)
GFR, Est Non African American: 77 mL/min/{1.73_m2} (ref >=60)
GLOBULIN: 2.5 g/dL (ref 1.9–3.7)
Glucose, Bld: 81 mg/dL (ref 65–99)
POTASSIUM: 5.8 mmol/L — AB (ref 3.5–5.3)
SODIUM: 139 mmol/L (ref 135–146)
Total Protein: 6.9 g/dL (ref 6.1–8.1)

## 2018-10-12 LAB — LIPID PANEL
CHOLESTEROL: 160 mg/dL (ref <200)
HDL: 58 mg/dL (ref >=50)
LDL Cholesterol (Calc): 87 mg/dL (calc)
Non-HDL Cholesterol (Calc): 102 mg/dL (calc) (ref <130)
TRIGLYCERIDES: 68 mg/dL (ref <150)
Total CHOL/HDL Ratio: 2.8 (calc) (ref <5.0)

## 2018-10-12 NOTE — Progress Notes (Signed)
Subjective:    Patient ID: Anna Jimenez, female    DOB: 1970-10-14, 48 y.o.   MRN: [id number]  HPI Patient is a very pleasant 48 year old Caucasian female here today for complete physical exam.  She denies any medical concerns.  She does occasionally experience Raynaud's phenomenon particularly in cold weather.  However this is not problematic for her.  We discussed amlodipine but she declines treatment for that today.  Unfortunately she continues to smoke.  She states that she smokes approximately 5 to 6 cigarettes a day.  She denies any withdrawal symptoms.  She agrees that this is mainly just out of habit.  At the present time she is pre-contemplative and has no desire to quit smoking.  She sees a gynecologist annually.  She believes her last Pap smear was last summer and everything was normal.  They are also performing her mammogram at their office.  Per her report her mammogram was normal.  She denies any family history of colon cancer.  She denies any melena or hematochezia.  Therefore there is no indication to perform colonoscopy earlier.  Otherwise she is doing well with no concerns.  She is due for a flu shot today but she politely declines this. Past Medical History:  Diagnosis Date  . External hemorrhoid   . Raynaud's disease   . Smoker    Past Surgical History:  Procedure Laterality Date  . ANAL EXAMINATION UNDER ANESTHESIA  08/12/12   and I&D  . MOUTH SURGERY  1999  . WISDOM TOOTH EXTRACTION  90's   Current Outpatient Medications on File Prior to Visit  Medication Sig Dispense Refill  . cetirizine (ZYRTEC) 10 MG tablet Take 1 tablet (10 mg total) by mouth daily. 90 tablet 3  . fluticasone (FLONASE) 50 MCG/ACT nasal spray Place 2 sprays into both nostrils daily. 16 g 6  . levonorgestrel (MIRENA) 20 MCG/24HR IUD 1 each by Intrauterine route once.    . valACYclovir (VALTREX) 1000 MG tablet TAKE 2 TABLETS (2,000 MG TOTAL) BY MOUTH 2 (TWO) TIMES DAILY. 4 tablet 3   No current  facility-administered medications on file prior to visit.    Allergies  Allergen Reactions  . Penicillins Other (See Comments)    Unsure- childhood reaction Unsure- childhood reaction   Social History   Socioeconomic History  . Marital status: Married    Spouse name: Not on file  . Number of children: Not on file  . Years of education: Not on file  . Highest education level: Not on file  Occupational History  . Not on file  Social Needs  . Financial resource strain: Not on file  . Food insecurity:    Worry: Not on file    Inability: Not on file  . Transportation needs:    Medical: Not on file    Non-medical: Not on file  Tobacco Use  . Smoking status: Current Some Day Smoker    Types: Cigarettes  . Smokeless tobacco: Never Used  Substance and Sexual Activity  . Alcohol use: Yes    Comment: Social  . Drug use: No  . Sexual activity: Yes  Lifestyle  . Physical activity:    Days per week: Not on file    Minutes per session: Not on file  . Stress: Not on file  Relationships  . Social connections:    Talks on phone: Not on file    Gets together: Not on file    Attends religious service: Not on file  Active member of club or organization: Not on file    Attends meetings of clubs or organizations: Not on file    Relationship status: Not on file  . Intimate partner violence:    Fear of current or ex partner: Not on file    Emotionally abused: Not on file    Physically abused: Not on file    Forced sexual activity: Not on file  Other Topics Concern  . Not on file  Social History Narrative  . Not on file   Family History  Problem Relation Age of Onset  . Hypertension Mother   . Polymyositis Mother   . Diabetes Maternal Grandfather   . Anesthesia problems Neg Hx   . Hypotension Neg Hx   . Malignant hyperthermia Neg Hx   . Pseudochol deficiency Neg Hx       Review of Systems  All other systems reviewed and are negative.      Objective:   Physical  Exam  Constitutional: She is oriented to person, place, and time. She appears well-developed and well-nourished. No distress.  HENT:  Head: Normocephalic and atraumatic.  Right Ear: External ear normal.  Left Ear: External ear normal.  Nose: Nose normal.  Mouth/Throat: Oropharynx is clear and moist. No oropharyngeal exudate.  Eyes: Pupils are equal, round, and reactive to light. Conjunctivae and EOM are normal. Right eye exhibits no discharge. Left eye exhibits no discharge. No scleral icterus.  Neck: Normal range of motion. Neck supple. No JVD present. No tracheal deviation present. No thyromegaly present.  Cardiovascular: Normal rate, regular rhythm and intact distal pulses. Exam reveals no gallop and no friction rub.  No murmur heard. Pulmonary/Chest: Effort normal and breath sounds normal. No stridor. No respiratory distress. She has no wheezes. She has no rales. She exhibits no tenderness.  Abdominal: Soft. Bowel sounds are normal. She exhibits no distension and no mass. There is no abdominal tenderness. There is no rebound and no guarding.  Musculoskeletal: Normal range of motion.        General: No tenderness or edema.  Lymphadenopathy:    She has no cervical adenopathy.  Neurological: She is alert and oriented to person, place, and time. She has normal reflexes. No cranial nerve deficit. She exhibits normal muscle tone. Coordination normal.  Skin: Skin is warm. No rash noted. She is not diaphoretic. No erythema. No pallor.  Psychiatric: She has a normal mood and affect. Her behavior is normal. Judgment and thought content normal.  Vitals reviewed.        Assessment & Plan:  Routine general medical examination at a health care facility - Plan: CBC with Differential/Platelet, COMPLETE METABOLIC PANEL WITH GFR, Lipid panel  Smoker  Patient's physical exam today is completely normal.  Her blood pressure is outstanding.  She is due for a flu shot but she politely declines this.   Pap smear and mammogram were performed at her gynecologist.  I will send a request to receive the records so that we can screen this into her chart.  Colonoscopy is not due until age 50 unless symptoms develop.  I will check a CBC to screen for any bone marrow abnormalities, CMP to monitor kidney and liver test as well as fasting blood sugar, and a fasting lipid panel.  Continue to encourage smoking cessation but at the present time the patient is pre-contemplative.  Discussed amlodipine for Raynaud's phenomenon but she politely declines.  Otherwise exam is excellent.  No abnormalities are identified.  Follow-up in  1 year or as needed.

## 2018-10-13 ENCOUNTER — Other Ambulatory Visit: Payer: Self-pay | Admitting: Family Medicine

## 2018-10-13 DIAGNOSIS — E875 Hyperkalemia: Secondary | ICD-10-CM

## 2018-10-15 ENCOUNTER — Other Ambulatory Visit: Payer: 59

## 2018-10-15 ENCOUNTER — Other Ambulatory Visit: Payer: Self-pay

## 2018-10-15 DIAGNOSIS — E875 Hyperkalemia: Secondary | ICD-10-CM

## 2018-10-16 LAB — BASIC METABOLIC PANEL
BUN: 7 mg/dL (ref 7–25)
CALCIUM: 9.7 mg/dL (ref 8.6–10.2)
CO2: 26 mmol/L (ref 20–32)
CREATININE: 0.91 mg/dL (ref 0.50–1.10)
Chloride: 104 mmol/L (ref 98–110)
Glucose, Bld: 101 mg/dL — ABNORMAL HIGH (ref 65–99)
POTASSIUM: 5.4 mmol/L — AB (ref 3.5–5.3)
Sodium: 138 mmol/L (ref 135–146)

## 2019-04-29 LAB — HM PAP SMEAR: HM Pap smear: NORMAL

## 2019-04-30 LAB — HM MAMMOGRAPHY: HM Mammogram: NORMAL (ref 0–4)

## 2019-09-30 ENCOUNTER — Other Ambulatory Visit: Payer: Self-pay | Admitting: Family Medicine

## 2019-11-29 ENCOUNTER — Other Ambulatory Visit: Payer: Self-pay

## 2019-11-29 ENCOUNTER — Encounter: Payer: Self-pay | Admitting: Family Medicine

## 2019-11-29 ENCOUNTER — Ambulatory Visit (INDEPENDENT_AMBULATORY_CARE_PROVIDER_SITE_OTHER): Payer: 59 | Admitting: Family Medicine

## 2019-11-29 VITALS — BP 128/74 | HR 80 | Temp 97.3°F | Resp 16 | Ht 69.5 in | Wt 175.0 lb

## 2019-11-29 DIAGNOSIS — B353 Tinea pedis: Secondary | ICD-10-CM

## 2019-11-29 NOTE — Progress Notes (Signed)
Subjective:    Patient ID: Anna Jimenez, female    DOB: Dec 14, 1970, 49 y.o.   MRN: [id number]  HPI  Patient presents with an itchy rash in the webspace between her fourth and fifth toes on her right foot.  The skin in between the toes is macerated and white with a fissure in the center of the crease.  The rash itches.  She has tried Lamisil on it for 1 day with no improvement.    Past Medical History:  Diagnosis Date  . External hemorrhoid   . Raynaud's disease   . Smoker    Past Surgical History:  Procedure Laterality Date  . ANAL EXAMINATION UNDER ANESTHESIA  08/12/12   and I&D  . MOUTH SURGERY  1999  . WISDOM TOOTH EXTRACTION  90's   Current Outpatient Medications on File Prior to Visit  Medication Sig Dispense Refill  . levonorgestrel (MIRENA) 20 MCG/24HR IUD 1 each by Intrauterine route once.    . valACYclovir (VALTREX) 1000 MG tablet TAKE 2 TABLETS (2,000 MG TOTAL) BY MOUTH 2 (TWO) TIMES DAILY. 4 tablet 3   No current facility-administered medications on file prior to visit.   Allergies  Allergen Reactions  . Penicillins Other (See Comments)    Unsure- childhood reaction Unsure- childhood reaction   Social History   Socioeconomic History  . Marital status: Married    Spouse name: Not on file  . Number of children: Not on file  . Years of education: Not on file  . Highest education level: Not on file  Occupational History  . Not on file  Tobacco Use  . Smoking status: Current Some Day Smoker    Types: Cigarettes  . Smokeless tobacco: Never Used  Substance and Sexual Activity  . Alcohol use: Yes    Comment: Social  . Drug use: No  . Sexual activity: Yes  Other Topics Concern  . Not on file  Social History Narrative  . Not on file   Social Determinants of Health   Financial Resource Strain:   . Difficulty of Paying Living Expenses:   Food Insecurity:   . Worried About Charity fundraiser in the Last Year:   . Arboriculturist  in the Last Year:   Transportation Needs:   . Film/video editor (Medical):   Marland Kitchen Lack of Transportation (Non-Medical):   Physical Activity:   . Days of Exercise per Week:   . Minutes of Exercise per Session:   Stress:   . Feeling of Stress :   Social Connections:   . Frequency of Communication with Friends and Family:   . Frequency of Social Gatherings with Friends and Family:   . Attends Religious Services:   . Active Member of Clubs or Organizations:   . Attends Archivist Meetings:   Marland Kitchen Marital Status:   Intimate Partner Violence:   . Fear of Current or Ex-Partner:   . Emotionally Abused:   Marland Kitchen Physically Abused:   . Sexually Abused:      Review of Systems  All other systems reviewed and are negative.      Objective:   Physical Exam  Cardiovascular: Normal rate, regular rhythm and normal heart sounds.  Pulmonary/Chest: Effort normal and breath sounds normal.  Musculoskeletal:        General: Tenderness present.  Skin: Rash noted. There is erythema.  Vitals reviewed.  Assessment & Plan:  Tinea pedis of right foot  Lamisil cream twice daily for 10 to 14 days.  Recheck in 2 weeks if no better or sooner if worsening.

## 2019-12-20 ENCOUNTER — Other Ambulatory Visit: Payer: Self-pay

## 2019-12-20 ENCOUNTER — Ambulatory Visit (INDEPENDENT_AMBULATORY_CARE_PROVIDER_SITE_OTHER): Payer: 59 | Admitting: Family Medicine

## 2019-12-20 ENCOUNTER — Encounter: Payer: Self-pay | Admitting: Family Medicine

## 2019-12-20 VITALS — BP 110/70 | HR 78 | Temp 97.9°F | Resp 16 | Ht 69.5 in | Wt 173.0 lb

## 2019-12-20 DIAGNOSIS — Z0000 Encounter for general adult medical examination without abnormal findings: Secondary | ICD-10-CM | POA: Diagnosis not present

## 2019-12-20 NOTE — Progress Notes (Signed)
Subjective:    Patient ID: Anna Jimenez, female    DOB: 17-Sep-1970, 49 y.o.   MRN: [id number]  HPI Patient is a very pleasant 49 year old Caucasian female here today for complete physical exam.  She denies any medical concerns.  Per her records, she is due for a Pap smear.  However she gets her Pap smear performed at her gynecologist and she states that her last Pap smear was less than 3 years ago.  She also states that her mammogram was performed last year.  I have asked her to sign a release of information so that I can get these records and have them scanned into her chart.  She denies any rectal bleeding.  She denies any family history of colon cancer.  Therefore there is no reason to do a colonoscopy prior to age 49 of present time.  Unfortunately patient continues to smoke.  I strongly recommended smoking cessation.  Also discussed Chantix however she is in the precontemplative phase at the present time.  She has not received her Covid vaccination.  I took this opportunity to explain the rationale behind vaccination and to encourage her to receive this vaccine.  Patient is hesitant due to the perceived lack of safety.  I tried to alleviate her fears regarding the vaccine. Past Medical History:  Diagnosis Date  . External hemorrhoid   . Raynaud's disease   . Smoker    Past Surgical History:  Procedure Laterality Date  . ANAL EXAMINATION UNDER ANESTHESIA  08/12/12   and I&D  . MOUTH SURGERY  1999  . WISDOM TOOTH EXTRACTION  90's   Current Outpatient Medications on File Prior to Visit  Medication Sig Dispense Refill  . levonorgestrel (MIRENA) 20 MCG/24HR IUD 1 each by Intrauterine route once.    . valACYclovir (VALTREX) 1000 MG tablet TAKE 2 TABLETS (2,000 MG TOTAL) BY MOUTH 2 (TWO) TIMES DAILY. 4 tablet 3   No current facility-administered medications on file prior to visit.   Allergies  Allergen Reactions  . Penicillins Other (See Comments)    Unsure- childhood reaction Unsure-  childhood reaction   Social History   Socioeconomic History  . Marital status: Married    Spouse name: Not on file  . Number of children: Not on file  . Years of education: Not on file  . Highest education level: Not on file  Occupational History  . Not on file  Tobacco Use  . Smoking status: Current Some Day Smoker    Types: Cigarettes  . Smokeless tobacco: Never Used  Substance and Sexual Activity  . Alcohol use: Yes    Comment: Social  . Drug use: No  . Sexual activity: Yes  Other Topics Concern  . Not on file  Social History Narrative  . Not on file   Social Determinants of Health   Financial Resource Strain:   . Difficulty of Paying Living Expenses:   Food Insecurity:   . Worried About Programme researcher, broadcasting/film/video in the Last Year:   . Barista in the Last Year:   Transportation Needs:   . Freight forwarder (Medical):   Marland Kitchen Lack of Transportation (Non-Medical):   Physical Activity:   . Days of Exercise per Week:   . Minutes of Exercise per Session:   Stress:   . Feeling of Stress :   Social Connections:   . Frequency of Communication with Friends and Family:   . Frequency of Social Gatherings with Friends and Family:   .  Attends Religious Services:   . Active Member of Clubs or Organizations:   . Attends Archivist Meetings:   Marland Kitchen Marital Status:   Intimate Partner Violence:   . Fear of Current or Ex-Partner:   . Emotionally Abused:   Marland Kitchen Physically Abused:   . Sexually Abused:    Family History  Problem Relation Age of Onset  . Hypertension Mother   . Polymyositis Mother   . Diabetes Maternal Grandfather   . Anesthesia problems Neg Hx   . Hypotension Neg Hx   . Malignant hyperthermia Neg Hx   . Pseudochol deficiency Neg Hx       Review of Systems  All other systems reviewed and are negative.      Objective:   Physical Exam  Constitutional: She is oriented to person, place, and time. She appears well-developed and well-nourished.  No distress.  HENT:  Head: Normocephalic and atraumatic.  Right Ear: External ear normal.  Left Ear: External ear normal.  Nose: Nose normal.  Mouth/Throat: Oropharynx is clear and moist. No oropharyngeal exudate.  Eyes: Pupils are equal, round, and reactive to light. Conjunctivae and EOM are normal. Right eye exhibits no discharge. Left eye exhibits no discharge. No scleral icterus.  Neck: No JVD present. No tracheal deviation present. No thyromegaly present.  Cardiovascular: Normal rate, regular rhythm and intact distal pulses. Exam reveals no gallop and no friction rub.  No murmur heard. Pulmonary/Chest: Effort normal and breath sounds normal. No stridor. No respiratory distress. She has no wheezes. She has no rales. She exhibits no tenderness.  Abdominal: Soft. Bowel sounds are normal. She exhibits no distension and no mass. There is no abdominal tenderness. There is no rebound and no guarding.  Musculoskeletal:        General: No tenderness or edema. Normal range of motion.     Cervical back: Normal range of motion and neck supple.  Lymphadenopathy:    She has no cervical adenopathy.  Neurological: She is alert and oriented to person, place, and time. She has normal reflexes. No cranial nerve deficit. She exhibits normal muscle tone. Coordination normal.  Skin: Skin is warm. No rash noted. She is not diaphoretic. No erythema. No pallor.  Psychiatric: She has a normal mood and affect. Her behavior is normal. Judgment and thought content normal.  Vitals reviewed.        Assessment & Plan:  General medical exam - Plan: CBC with Differential/Platelet, COMPLETE METABOLIC PANEL WITH GFR, Lipid panel  Patient's physical exam today is completely normal.  Strongly recommended smoking cessation however patient is in the precontemplative phase.  I also strongly recommended the Covid vaccination.  I spent 10 minutes explaining the rationale and the signs behind.  She will consider.  I asked  the patient to give [place] a release of information form so that we can get her Pap smear and her mammogram from her gynecologist.  She is not yet due for colon cancer screening.  Check CBC, CMP, fasting lipid panel.

## 2019-12-21 LAB — CBC WITH DIFFERENTIAL/PLATELET
Absolute Monocytes: 446 cells/uL (ref 200–950)
Basophils Absolute: 50 cells/uL (ref 0–200)
Basophils Relative: 1.1 %
Eosinophils Absolute: 140 cells/uL (ref 15–500)
Eosinophils Relative: 3.1 %
HCT: 46.8 % — ABNORMAL HIGH (ref 35.0–45.0)
Hemoglobin: 15.8 g/dL — ABNORMAL HIGH (ref 11.7–15.5)
Lymphs Abs: 1913 cells/uL (ref 850–3900)
MCH: 32.9 pg (ref 27.0–33.0)
MCHC: 33.8 g/dL (ref 32.0–36.0)
MCV: 97.5 fL (ref 80.0–100.0)
MPV: 10.5 fL (ref 7.5–12.5)
Monocytes Relative: 9.9 %
Neutro Abs: 1953 cells/uL (ref 1500–7800)
Neutrophils Relative %: 43.4 %
Platelets: 186 10*3/uL (ref 140–400)
RBC: 4.8 10*6/uL (ref 3.80–5.10)
RDW: 11.9 % (ref 11.0–15.0)
Total Lymphocyte: 42.5 %
WBC: 4.5 10*3/uL (ref 3.8–10.8)

## 2019-12-21 LAB — COMPLETE METABOLIC PANEL WITH GFR
AG Ratio: 1.7 (calc) (ref 1.0–2.5)
ALT: 17 U/L (ref 6–29)
AST: 23 U/L (ref 10–35)
Albumin: 4.6 g/dL (ref 3.6–5.1)
Alkaline phosphatase (APISO): 45 U/L (ref 31–125)
BUN/Creatinine Ratio: 5 (calc) — ABNORMAL LOW (ref 6–22)
BUN: 5 mg/dL — ABNORMAL LOW (ref 7–25)
CO2: 25 mmol/L (ref 20–32)
Calcium: 9.6 mg/dL (ref 8.6–10.2)
Chloride: 102 mmol/L (ref 98–110)
Creat: 0.91 mg/dL (ref 0.50–1.10)
GFR, Est African American: 87 mL/min/{1.73_m2} (ref >=60)
GFR, Est Non African American: 75 mL/min/{1.73_m2} (ref >=60)
Globulin: 2.7 g/dL (calc) (ref 1.9–3.7)
Glucose, Bld: 90 mg/dL (ref 65–99)
Potassium: 5.2 mmol/L (ref 3.5–5.3)
Sodium: 137 mmol/L (ref 135–146)
Total Bilirubin: 0.5 mg/dL (ref 0.2–1.2)
Total Protein: 7.3 g/dL (ref 6.1–8.1)

## 2019-12-21 LAB — LIPID PANEL
Cholesterol: 164 mg/dL (ref <200)
HDL: 61 mg/dL (ref >=50)
LDL Cholesterol (Calc): 80 mg/dL (calc)
Non-HDL Cholesterol (Calc): 103 mg/dL (calc) (ref <130)
Total CHOL/HDL Ratio: 2.7 (calc) (ref <5.0)
Triglycerides: 135 mg/dL (ref <150)

## 2020-04-03 ENCOUNTER — Encounter: Payer: Self-pay | Admitting: Family Medicine

## 2020-08-28 ENCOUNTER — Telehealth: Payer: Self-pay | Admitting: Family Medicine

## 2020-08-28 ENCOUNTER — Encounter: Payer: Self-pay | Admitting: Nurse Practitioner

## 2020-08-28 ENCOUNTER — Telehealth (INDEPENDENT_AMBULATORY_CARE_PROVIDER_SITE_OTHER): Payer: 59 | Admitting: Nurse Practitioner

## 2020-08-28 ENCOUNTER — Other Ambulatory Visit: Payer: Self-pay

## 2020-08-28 DIAGNOSIS — U071 COVID-19: Secondary | ICD-10-CM

## 2020-08-28 MED ORDER — GUAIFENESIN ER 600 MG PO TB12: 600.0000 mg | ORAL_TABLET | Freq: Two times a day (BID) | ORAL | 0 refills | Status: AC | PRN

## 2020-08-28 MED ORDER — BENZONATATE 100 MG PO CAPS: 100.0000 mg | ORAL_CAPSULE | Freq: Two times a day (BID) | ORAL | 0 refills | Status: AC | PRN

## 2020-08-28 NOTE — Progress Notes (Signed)
Subjective:    Patient ID: Anna Jimenez, female    DOB: 01/19/1971, 50 y.o.   MRN: [id number]  HPI: Anna Jimenez is a 50 y.o. female presenting virtually for COVID-19 follow up.   Chief Complaint  Patient presents with  . Covid Positive   UPPER RESPIRATORY TRACT INFECTION Started feeling bad, tired, 08/24/2020; tested positive 08/27/2020.  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 Onset: ~4 days ago Worst symptom: cough, tired Fever: no Cough: yes Shortness of breath: no Wheezing: no Chest pain: no Chest tightness: no Chest congestion: no Nasal congestion: yes Runny nose: yes Post nasal drip: no Sneezing: yes Sore throat: yes; improving Swollen glands: no Sinus pressure: no Headache: no Face pain: no Toothache: no Ear pain: no  Ear pressure: no  Eyes red/itching:no Eye drainage/crusting: no  Nausea: no  Vomiting: no Diarrhea: no  Change in appetite: yes; decreased but improved Loss of taste/smell: no  Rash: no Fatigue: yes Sick contacts: no Strep contacts: no  Context: better Recurrent sinusitis: no Treatments attempted: Advil, mucinex Relief with OTC medications: some  Allergies  Allergen Reactions  . Penicillins Other (See Comments)    Unsure- childhood reaction Unsure- childhood reaction    Outpatient Encounter Medications as of 08/28/2020  Medication Sig  . benzonatate (TESSALON) 100 MG capsule Take 1 capsule (100 mg total) by mouth 2 (two) times daily as needed for cough.  Marland Kitchen guaiFENesin (MUCINEX) 600 MG 12 hr tablet Take 1 tablet (600 mg total) by mouth 2 (two) times daily as needed for cough or to loosen phlegm.  Marland Kitchen levonorgestrel (MIRENA) 20 MCG/24HR IUD 1 each by Intrauterine route once.  . valACYclovir (VALTREX) 1000 MG tablet TAKE 2 TABLETS (2,000 MG TOTAL) BY MOUTH 2 (TWO) TIMES DAILY.   No facility-administered encounter medications on file as of 08/28/2020.    Patient Active Problem List   Diagnosis Date Noted  . COVID-19 08/28/2020  . Smoker    . Muscle strain 05/16/2013  . 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 External hemorrhoid   . Raynaud's disease   . Smoker   . SROM (spontaneous rupture of membranes) 05/27/2011  . SVD (spontaneous vaginal delivery) 05/27/2011  . URI, acute 05/16/2013    Relevant past medical, surgical, family and social history reviewed and updated as indicated. Interim medical history since our last visit reviewed.  Review of Systems Per HPI unless specifically indicated above     Objective:    There were no vitals taken for this visit.  Wt Readings from Last 3 Encounters:  12/20/19 173 lb (78.5 kg)  11/29/19 175 lb (79.4 kg)  10/12/18 166 lb (75.3 kg)    Physical Exam Physical examination unable to be performed due to lack of equipment.  Patient talking in complete sentences during telemedicine visit.    Assessment & Plan:   Problem List Items Addressed This Visit      Other   COVID-19 - Primary    Symptoms started last Thursday and are much improved currently.  Will continue supportive care for now.  Reassured patient that symptoms and exam findings are most consistent with a viral upper respiratory infection and explained lack of efficacy of antibiotics against viruses.  Discussed expected course and features suggestive of secondary bacterial infection.  Continue supportive care. Increase fluid intake with water or electrolyte solution like pedialyte. Encouraged acetaminophen as needed for fever/pain. Encouraged salt water gargling, chloraseptic spray and throat lozenges. Encouraged OTC guaifenesin.  Encouraged saline sinus flushes and/or neti with humidified air.  Tessalon perles for dry cough.  Note sent to [facility] for work.  If symptoms persist >2 weeks, return to clinic.  With any sudden onset new chest pain, dizziness, sweating, or shortness of breath, go to ED.        Relevant Medications   guaiFENesin (MUCINEX) 600 MG 12 hr tablet   benzonatate  (TESSALON) 100 MG capsule       Follow up plan:  Return if symptoms worsen or fail to improve.  This visit was completed via telephone due to the restrictions of the COVID-19 pandemic. All issues as above were discussed and addressed but no physical exam was performed. If it was felt that the patient should be evaluated in the office, they were directed there. The patient verbally consented to this visit. Patient was unable to complete an audio/visual visit due to Lack of equipment. . Location of the patient: home . Location of the provider: work . Those involved with this call:  . Provider: Mardene Celeste, DNP . CMA:  n/a . Front Desk/Registration: Flavia Shipper  . Time spent on call: 15 minutes on the phone discussing health concerns. 30 minutes total spent in review of patient's record and preparation of their chart.  I verified patient identity using two factors (patient name and date of birth). Patient consents verbally to being seen via telemedicine visit today.

## 2020-08-28 NOTE — Patient Instructions (Signed)

## 2020-08-28 NOTE — Telephone Encounter (Signed)
Call placed to patient.   States that she had virtual visit with NP and questions were answered.

## 2020-08-28 NOTE — Assessment & Plan Note (Signed)
Symptoms started last Thursday and are much improved currently.  Will continue supportive care for now.  Reassured patient that symptoms and exam findings are most consistent with a viral upper respiratory infection and explained lack of efficacy of antibiotics against viruses.  Discussed expected course and features suggestive of secondary bacterial infection.  Continue supportive care. Increase fluid intake with water or electrolyte solution like pedialyte. Encouraged acetaminophen as needed for fever/pain. Encouraged salt water gargling, chloraseptic spray and throat lozenges. Encouraged OTC guaifenesin. Encouraged saline sinus flushes and/or neti with humidified air.  Tessalon perles for dry cough.  Note sent to [facility] for work.  If symptoms persist >2 weeks, return to clinic.  With any sudden onset new chest pain, dizziness, sweating, or shortness of breath, go to ED.

## 2020-08-28 NOTE — Telephone Encounter (Signed)
Was tested positive for covid on 08/27/20 would like to know how long she have to be out of work she is vaccinated

## 2021-01-11 ENCOUNTER — Other Ambulatory Visit: Payer: Self-pay | Admitting: *Deleted

## 2021-01-11 MED ORDER — VALACYCLOVIR HCL 1 G PO TABS: 2000.0000 mg | ORAL_TABLET | Freq: Two times a day (BID) | ORAL | 3 refills | Status: DC

## 2021-07-04 ENCOUNTER — Other Ambulatory Visit: Payer: Self-pay

## 2021-07-04 ENCOUNTER — Encounter: Payer: Self-pay | Admitting: Nurse Practitioner

## 2021-07-04 ENCOUNTER — Telehealth (INDEPENDENT_AMBULATORY_CARE_PROVIDER_SITE_OTHER): Payer: 59 | Admitting: Nurse Practitioner

## 2021-07-04 DIAGNOSIS — J069 Acute upper respiratory infection, unspecified: Secondary | ICD-10-CM

## 2021-07-04 DIAGNOSIS — J029 Acute pharyngitis, unspecified: Secondary | ICD-10-CM

## 2021-07-04 MED ORDER — SALINE SPRAY 0.65 % NA SOLN: 1.0000 | NASAL | 0 refills | Status: AC | PRN

## 2021-07-04 NOTE — Progress Notes (Signed)
Subjective:    Patient ID: Anna Jimenez, female    DOB: 30-Jun-1971, 50 y.o.   MRN: [id number]  HPI: Anna Jimenez is a 50 y.o. female presenting virtually for cough.  Chief Complaint  Patient presents with   URI    UPPER RESPIRATORY TRACT INFECTION Onset: 11/27; 1 week ago, daughter was sick COVID testing: at home negative yesterday 11/30 Fever: no Body aches: no Chills: no Cough: no Shortness of breath: no Wheezing: no Chest pain: no Chest tightness: no Chest congestion: no Nasal congestion: yes Runny nose: yes Post nasal drip: yes Sneezing: yes Sore throat: no Swollen glands: no Sinus pressure: no Headache: no Face pain: no Toothache: no Ear pain: no  Ear pressure: no  Eyes red/itching:yes Eye drainage/crusting: no  Nausea: no  Vomiting: no Diarrhea: no  Change in appetite: no  Loss of taste/smell: no  Rash: no Fatigue: yes Sick contacts: yes; family has been sick Strep contacts: no  Context: better Recurrent sinusitis:no Treatments attempted: Advil Relief with OTC medications: yes  Allergies  Allergen Reactions   Penicillins Other (See Comments)    Unsure- childhood reaction Unsure- childhood reaction    Outpatient Encounter Medications as of 07/04/2021  Medication Sig   sodium chloride (OCEAN) 0.65 % SOLN nasal spray Place 1 spray into both nostrils as needed for congestion.   benzonatate (TESSALON) 100 MG capsule Take 1 capsule (100 mg total) by mouth 2 (two) times daily as needed for cough.   guaiFENesin (MUCINEX) 600 MG 12 hr tablet Take 1 tablet (600 mg total) by mouth 2 (two) times daily as needed for cough or to loosen phlegm.   levonorgestrel (MIRENA) 20 MCG/24HR IUD 1 each by Intrauterine route once.   valACYclovir (VALTREX) 1000 MG tablet Take 2 tablets (2,000 mg total) by mouth 2 (two) times daily.   No facility-administered encounter medications on file as of 07/04/2021.    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 Interim medical history since our last visit reviewed.  Review of Systems Per HPI unless specifically indicated above     Objective:    There were no vitals taken for this visit.  Wt Readings from Last 3 Encounters:  12/20/19 173 lb (78.5 kg)  11/29/19 175 lb (79.4 kg)  10/12/18 166 lb (75.3 kg)    Physical Exam Vitals and nursing note reviewed.  Constitutional:      General: She is not in acute distress.    Appearance: Normal appearance. She is not ill-appearing or toxic-appearing.  HENT:     Head: Normocephalic and atraumatic.     Nose: Congestion present. No rhinorrhea.     Mouth/Throat:     Mouth: Mucous membranes are moist.     Pharynx: Oropharynx is clear.  Eyes:     General: No scleral icterus.       Right eye: No discharge.        Left eye: No discharge.  Cardiovascular:     Comments: Unable to assess heart sounds via virtual visit. Pulmonary:  Effort: Pulmonary effort is normal. No respiratory distress.     Comments: Unable to assess lung sounds via virtual visit.  Patient talking in complete sentences during telemedicine visit.  No accessory muscle use. Skin:    Coloration: Skin is not jaundiced or pale.     Findings: No erythema.  Neurological:     Mental Status: She is alert and oriented to person, place, and time.  Psychiatric:        Mood and Affect: Mood normal.        Behavior: Behavior normal.        Thought Content: Thought content normal.        Judgment: Judgment normal.       Assessment & Plan:  1. Viral upper respiratory tract  infection Acute x3 days.  Symptoms are improving.  Suspect viral etiology-obtain viral testing.  Reassured patient that symptoms and exam findings are most consistent with a viral upper respiratory infection and explained lack of efficacy of antibiotics against viruses.  Discussed expected course and features suggestive of secondary bacterial infection.  Continue supportive care. Increase fluid intake with water or electrolyte solution like pedialyte. Encouraged acetaminophen as needed for fever/pain. Encouraged salt water gargling, chloraseptic spray and throat lozenges. Encouraged OTC guaifenesin. Encouraged saline sinus flushes and/or neti with humidified air.  Follow-up if symptoms are not improving after 7 days.  Note given for work.  - sodium chloride (OCEAN) 0.65 % SOLN nasal spray; Place 1 spray into both nostrils as needed for congestion.  Dispense: 88 mL; Refill: 0 - SARS-CoV-2 RNA (COVID-19) and Respiratory Viral Panel, Qualitative NAAT - STREP GROUP A AG, W/REFLEX TO CULT  2. Acute pharyngitis, unspecified etiology Acute.  Patient reports history of strep throat in adulthood-we will obtain strep testing today.  Treat with cephalosporin if positive given penicillin allergy.  - SARS-CoV-2 RNA (COVID-19) and Respiratory Viral Panel, Qualitative [facility] - STREP GROUP A AG, W/REFLEX TO CULT    Follow up plan: Return if symptoms worsen or fail to improve.   Due to the catastrophic nature of the COVID-19 pandemic, this video visit was completed soley via audio and visual contact via Caregility due to the restrictions of the COVID-19 pandemic.  All issues as above were discussed and addressed. Physical exam was done as above through visual confirmation on Caregility. If it was felt that the patient should be evaluated in the office, they were directed there. The patient verbally consented to this visit. Location of the patient: home Location of the provider: work Those involved with this call:   Provider: Cathlean Marseilles, DNP, FNP-C CMA: n/a Front Desk/Registration: Percival Spanish  Time spent on call:  11 minutes with patient face to face via video conference. More than 50% of this time was spent in counseling and coordination of care. 15 minutes total spent in review of patient's record and preparation of their chart. I verified patient identity using two factors (patient name and date of birth). Patient consents verbally to being seen via telemedicine visit today.

## 2021-07-06 LAB — CULTURE, GROUP A STREP
MICRO NUMBER:: 12695948
SPECIMEN QUALITY:: ADEQUATE

## 2021-07-06 LAB — STREP GROUP A AG, W/REFLEX TO CULT: Streptococcus Group A AG: NOT DETECTED

## 2021-07-09 ENCOUNTER — Encounter: Payer: Self-pay | Admitting: Nurse Practitioner

## 2021-07-09 LAB — SARS-COV-2 RNA (COVID-19) RESP VIRAL PNL QL NAAT
Adenovirus B: NOT DETECTED
HUMAN PARAINFLU VIRUS 1: NOT DETECTED
HUMAN PARAINFLU VIRUS 2: NOT DETECTED
HUMAN PARAINFLU VIRUS 3: NOT DETECTED
INFLUENZA A SUBTYPE H1: NOT DETECTED
INFLUENZA A SUBTYPE H3: DETECTED — AB
Influenza A: DETECTED — AB
Influenza B: NOT DETECTED
Metapneumovirus: NOT DETECTED
Respiratory Syncytial Virus A: NOT DETECTED
Respiratory Syncytial Virus B: NOT DETECTED
Rhinovirus: NOT DETECTED
SARS CoV2 RNA: NOT DETECTED

## 2021-12-07 ENCOUNTER — Encounter: Payer: Self-pay | Admitting: Family Medicine

## 2021-12-07 ENCOUNTER — Ambulatory Visit (INDEPENDENT_AMBULATORY_CARE_PROVIDER_SITE_OTHER): Payer: 59 | Admitting: Family Medicine

## 2021-12-07 VITALS — BP 112/82 | HR 69 | Temp 97.8°F | Ht 69.0 in | Wt 190.0 lb

## 2021-12-07 DIAGNOSIS — Z0000 Encounter for general adult medical examination without abnormal findings: Secondary | ICD-10-CM | POA: Diagnosis not present

## 2021-12-07 DIAGNOSIS — Z1211 Encounter for screening for malignant neoplasm of colon: Secondary | ICD-10-CM | POA: Diagnosis not present

## 2021-12-07 NOTE — Progress Notes (Signed)
? ?Subjective:  ? ? Patient ID: Anna Jimenez, female    DOB: 06/08/1971, 51 y.o.   MRN: [id number] ? ?HPI ? ?Extremely proud of this patient.  She quit smoking 2 years ago!  She is doing well with this.  Her blood pressure is excellent today at 112/82.  She is here today for physical exam.  Her mammogram was performed at her gynecologist office.  Her Pap smear was also performed there.  She is due for colon cancer screening.  She is due for a tetanus shot as well as a booster on her COVID shot however she politely declines both of these today.  She does have mild rosacea on both cheeks.  We discussed treatment with doxycycline but she declines this at the present time.  Otherwise she is doing well. ?Past Medical History:  ?Diagnosis Date  ? External hemorrhoid   ? Raynaud's disease   ? Smoker   ? SROM (spontaneous rupture of membranes) 05/27/2011  ? SVD (spontaneous vaginal delivery) 05/27/2011  ? URI, acute 05/16/2013  ? ?Past Surgical History:  ?Procedure Laterality Date  ? ANAL EXAMINATION UNDER ANESTHESIA  08/12/12  ? and I&D  ? MOUTH SURGERY  1999  ? WISDOM TOOTH EXTRACTION  90's  ? ?Current Outpatient Medications on File Prior to Visit  ?Medication Sig Dispense Refill  ? benzonatate (TESSALON) 100 MG capsule Take 1 capsule (100 mg total) by mouth 2 (two) times daily as needed for cough. 20 capsule 0  ? guaiFENesin (MUCINEX) 600 MG 12 hr tablet Take 1 tablet (600 mg total) by mouth 2 (two) times daily as needed for cough or to loosen phlegm. 30 tablet 0  ? levonorgestrel (MIRENA) 20 MCG/24HR IUD 1 each by Intrauterine route once.    ? sodium chloride (OCEAN) 0.65 % SOLN nasal spray Place 1 spray into both nostrils as needed for congestion. 88 mL 0  ? valACYclovir (VALTREX) 1000 MG tablet Take 2 tablets (2,000 mg total) by mouth 2 (two) times daily. 4 tablet 3  ? ?No current facility-administered medications on file prior to visit.  ? ?Allergies  ?Allergen Reactions  ? Penicillins Other (See Comments)  ?  Unsure-  childhood reaction ?Unsure- childhood reaction  ? ?Social History  ? ?Socioeconomic History  ? Marital status: Married  ?  Spouse name: Not on file  ? Number of children: Not on file  ? Years of education: Not on file  ? Highest education level: Not on file  ?Occupational History  ? Not on file  ?Tobacco Use  ? Smoking status: Some Days  ?  Types: Cigarettes  ? Smokeless tobacco: Never  ?Substance and Sexual Activity  ? Alcohol use: Yes  ?  Comment: Social  ? Drug use: No  ? Sexual activity: Yes  ?Other Topics Concern  ? Not on file  ?Social History Narrative  ? Not on file  ? ?Social Determinants of Health  ? ?Financial Resource Strain: Not on file  ?Food Insecurity: Not on file  ?Transportation Needs: Not on file  ?Physical Activity: Not on file  ?Stress: Not on file  ?Social Connections: Not on file  ?Intimate Partner Violence: Not on file  ? ?Family History  ?Problem Relation Age of Onset  ? Hypertension Mother   ? Polymyositis Mother   ? Diabetes Maternal Grandfather   ? Anesthesia problems Neg Hx   ? Hypotension Neg Hx   ? Malignant hyperthermia Neg Hx   ? Pseudochol deficiency Neg Hx   ? ? ? ? ?  Review of Systems  ?All other systems reviewed and are negative. ? ?   ?Objective:  ? Physical Exam ?Vitals reviewed.  ?Constitutional:   ?   General: She is not in acute distress. ?   Appearance: She is well-developed. She is not diaphoretic.  ?HENT:  ?   Head: Normocephalic and atraumatic.  ?   Right Ear: Tympanic membrane, ear canal and external ear normal.  ?   Left Ear: Tympanic membrane, ear canal and external ear normal.  ?   Nose: Nose normal. No congestion or rhinorrhea.  ?   Mouth/Throat:  ?   Mouth: Mucous membranes are moist.  ?   Pharynx: Oropharynx is clear. Posterior oropharyngeal erythema present. No oropharyngeal exudate.  ?Eyes:  ?   General: No scleral icterus.    ?   Right eye: No discharge.     ?   Left eye: No discharge.  ?   Conjunctiva/sclera: Conjunctivae normal.  ?   Pupils: Pupils are equal,  round, and reactive to light.  ?Neck:  ?   Thyroid: No thyromegaly.  ?   Vascular: No carotid bruit or JVD.  ?   Trachea: No tracheal deviation.  ?Cardiovascular:  ?   Rate and Rhythm: Normal rate and regular rhythm.  ?   Heart sounds: No murmur heard. ?  No friction rub. No gallop.  ?Pulmonary:  ?   Effort: Pulmonary effort is normal. No respiratory distress.  ?   Breath sounds: Normal breath sounds. No stridor. No wheezing, rhonchi or rales.  ?Chest:  ?   Chest wall: No tenderness.  ?Abdominal:  ?   General: Bowel sounds are normal. There is no distension.  ?   Palpations: Abdomen is soft. There is no mass.  ?   Tenderness: There is no abdominal tenderness. There is no guarding or rebound.  ?Musculoskeletal:     ?   General: No tenderness. Normal range of motion.  ?   Cervical back: Normal range of motion and neck supple.  ?   Right lower leg: No edema.  ?   Left lower leg: No edema.  ?Lymphadenopathy:  ?   Cervical: No cervical adenopathy.  ?Skin: ?   General: Skin is warm.  ?   Coloration: Skin is not pale.  ?   Findings: Rash present. No erythema.  ?Neurological:  ?   General: No focal deficit present.  ?   Mental Status: She is alert and oriented to person, place, and time.  ?   Cranial Nerves: No cranial nerve deficit.  ?   Motor: No weakness or abnormal muscle tone.  ?   Coordination: Coordination normal.  ?   Gait: Gait normal.  ?   Deep Tendon Reflexes: Reflexes are normal and symmetric. Reflexes normal.  ?Psychiatric:     ?   Behavior: Behavior normal.     ?   Thought Content: Thought content normal.     ?   Judgment: Judgment normal.  ? ? ? ? ?   ?Assessment & Plan:  ?General medical exam - Plan: CBC with Differential/Platelet, Lipid panel, COMPLETE METABOLIC PANEL WITH GFR ? ?Colon cancer screening - Plan: Ambulatory referral to Gastroenterology ?Pap smear is performed in her gynecologist as is her mammogram.  I will check a CBC CMP and a lipid panel.  Also recommended a colonoscopy and I will schedule  her for this.  We discussed the tetanus shot as well as a COVID shot and she declines both of  those today.  She does have rosacea on her cheeks and nose but she declines treatment at the present time.  I congratulated the patient on her smoking cessation. ?

## 2021-12-08 LAB — COMPLETE METABOLIC PANEL WITH GFR
AG Ratio: 2 (calc) (ref 1.0–2.5)
ALT: 20 U/L (ref 6–29)
AST: 21 U/L (ref 10–35)
Albumin: 4.8 g/dL (ref 3.6–5.1)
Alkaline phosphatase (APISO): 40 U/L (ref 31–125)
BUN/Creatinine Ratio: 8 (calc) (ref 6–22)
BUN: 8 mg/dL (ref 7–25)
CO2: 24 mmol/L (ref 20–32)
Calcium: 9.4 mg/dL (ref 8.6–10.2)
Chloride: 104 mmol/L (ref 98–110)
Creat: 1.03 mg/dL — ABNORMAL HIGH (ref 0.50–0.99)
Globulin: 2.4 g/dL (calc) (ref 1.9–3.7)
Glucose, Bld: 80 mg/dL (ref 65–99)
Potassium: 5 mmol/L (ref 3.5–5.3)
Sodium: 138 mmol/L (ref 135–146)
Total Bilirubin: 0.4 mg/dL (ref 0.2–1.2)
Total Protein: 7.2 g/dL (ref 6.1–8.1)
eGFR: 67 mL/min/{1.73_m2} (ref >=60)

## 2021-12-08 LAB — CBC WITH DIFFERENTIAL/PLATELET
Absolute Monocytes: 291 cells/uL (ref 200–950)
Basophils Absolute: 51 cells/uL (ref 0–200)
Basophils Relative: 1 %
Eosinophils Absolute: 112 cells/uL (ref 15–500)
Eosinophils Relative: 2.2 %
HCT: 43.6 % (ref 35.0–45.0)
Hemoglobin: 15 g/dL (ref 11.7–15.5)
Lymphs Abs: 2438 cells/uL (ref 850–3900)
MCH: 33.1 pg — ABNORMAL HIGH (ref 27.0–33.0)
MCHC: 34.4 g/dL (ref 32.0–36.0)
MCV: 96.2 fL (ref 80.0–100.0)
MPV: 10.6 fL (ref 7.5–12.5)
Monocytes Relative: 5.7 %
Neutro Abs: 2208 cells/uL (ref 1500–7800)
Neutrophils Relative %: 43.3 %
Platelets: 217 10*3/uL (ref 140–400)
RBC: 4.53 10*6/uL (ref 3.80–5.10)
RDW: 12.4 % (ref 11.0–15.0)
Total Lymphocyte: 47.8 %
WBC: 5.1 10*3/uL (ref 3.8–10.8)

## 2021-12-08 LAB — LIPID PANEL
Cholesterol: 189 mg/dL (ref <200)
HDL: 53 mg/dL (ref >=50)
LDL Cholesterol (Calc): 106 mg/dL (calc) — ABNORMAL HIGH
Non-HDL Cholesterol (Calc): 136 mg/dL (calc) — ABNORMAL HIGH (ref <130)
Total CHOL/HDL Ratio: 3.6 (calc) (ref <5.0)
Triglycerides: 179 mg/dL — ABNORMAL HIGH (ref <150)

## 2022-02-28 ENCOUNTER — Encounter: Payer: Self-pay | Admitting: Family Medicine

## 2022-05-07 ENCOUNTER — Other Ambulatory Visit: Payer: Self-pay

## 2022-05-07 ENCOUNTER — Telehealth: Payer: Self-pay

## 2022-05-07 ENCOUNTER — Other Ambulatory Visit: Payer: Self-pay | Admitting: Family Medicine

## 2022-05-07 MED ORDER — VALACYCLOVIR HCL 1 G PO TABS: 2000.0000 mg | ORAL_TABLET | Freq: Two times a day (BID) | ORAL | 3 refills | Status: DC

## 2022-05-07 NOTE — Telephone Encounter (Signed)
Pharmacy faxed a refill request for valACYclovir (VALTREX) 1000 MG tablet [[id number]]    Order Details Dose: 2,000 mg Route: Oral Frequency: 2 times daily  Dispense Quantity: 4 tablet Refills: 3        Sig: Take 2 tablets (2,000 mg total) by mouth 2 (two) times daily.       Start Date: 01/11/21 End Date: --  Written Date: 01/11/21 Expiration Date: 01/11/22  Original Order:  valACYclovir (VALTREX) 1000 MG tablet [[id number]]

## 2022-05-07 NOTE — Telephone Encounter (Signed)
Pt asks if a quantity of more than 4 pills can be given to her of her Valtrex? Pt states that when she gets a cold sore, she has to call pharmacy and get refill and sometimes there is a delay and the cold sore "gets really bad" before she can get her medication. Is this okay to do? Thank you!

## 2022-08-27 ENCOUNTER — Other Ambulatory Visit: Payer: Self-pay | Admitting: Family Medicine

## 2022-08-27 DIAGNOSIS — Z1231 Encounter for screening mammogram for malignant neoplasm of breast: Secondary | ICD-10-CM

## 2022-11-14 ENCOUNTER — Encounter: Payer: Self-pay | Admitting: Family Medicine

## 2023-04-09 ENCOUNTER — Other Ambulatory Visit: Payer: BLUE CROSS/BLUE SHIELD

## 2023-04-09 DIAGNOSIS — E782 Mixed hyperlipidemia: Secondary | ICD-10-CM

## 2023-04-09 DIAGNOSIS — F17200 Nicotine dependence, unspecified, uncomplicated: Secondary | ICD-10-CM

## 2023-04-09 DIAGNOSIS — Z1211 Encounter for screening for malignant neoplasm of colon: Secondary | ICD-10-CM

## 2023-04-10 LAB — COMPLETE METABOLIC PANEL WITH GFR
AG Ratio: 1.9 (calc) (ref 1.0–2.5)
ALT: 19 U/L (ref 6–29)
AST: 17 U/L (ref 10–35)
Albumin: 4.7 g/dL (ref 3.6–5.1)
Alkaline phosphatase (APISO): 47 U/L (ref 37–153)
BUN: 9 mg/dL (ref 7–25)
CO2: 25 mmol/L (ref 20–32)
Calcium: 9.4 mg/dL (ref 8.6–10.4)
Chloride: 102 mmol/L (ref 98–110)
Creat: 0.94 mg/dL (ref 0.50–1.03)
Globulin: 2.5 g/dL (ref 1.9–3.7)
Glucose, Bld: 79 mg/dL (ref 65–99)
Potassium: 4.7 mmol/L (ref 3.5–5.3)
Sodium: 137 mmol/L (ref 135–146)
Total Bilirubin: 0.6 mg/dL (ref 0.2–1.2)
Total Protein: 7.2 g/dL (ref 6.1–8.1)
eGFR: 74 mL/min/{1.73_m2} (ref >=60)

## 2023-04-10 LAB — LIPID PANEL
Cholesterol: 171 mg/dL (ref <200)
HDL: 45 mg/dL — ABNORMAL LOW (ref >=50)
LDL Cholesterol (Calc): 97 mg/dL
Non-HDL Cholesterol (Calc): 126 mg/dL (ref <130)
Total CHOL/HDL Ratio: 3.8 (calc) (ref <5.0)
Triglycerides: 194 mg/dL — ABNORMAL HIGH (ref <150)

## 2023-04-10 LAB — CBC WITH DIFFERENTIAL/PLATELET
Absolute Monocytes: 398 {cells}/uL (ref 200–950)
Basophils Absolute: 42 {cells}/uL (ref 0–200)
Basophils Relative: 0.8 %
Eosinophils Absolute: 80 {cells}/uL (ref 15–500)
Eosinophils Relative: 1.5 %
HCT: 43.9 % (ref 35.0–45.0)
Hemoglobin: 15 g/dL (ref 11.7–15.5)
Lymphs Abs: 2120 {cells}/uL (ref 850–3900)
MCH: 32.3 pg (ref 27.0–33.0)
MCHC: 34.2 g/dL (ref 32.0–36.0)
MCV: 94.6 fL (ref 80.0–100.0)
MPV: 10.8 fL (ref 7.5–12.5)
Monocytes Relative: 7.5 %
Neutro Abs: 2661 {cells}/uL (ref 1500–7800)
Neutrophils Relative %: 50.2 %
Platelets: 200 10*3/uL (ref 140–400)
RBC: 4.64 10*6/uL (ref 3.80–5.10)
RDW: 11.9 % (ref 11.0–15.0)
Total Lymphocyte: 40 %
WBC: 5.3 10*3/uL (ref 3.8–10.8)

## 2023-04-15 ENCOUNTER — Ambulatory Visit (INDEPENDENT_AMBULATORY_CARE_PROVIDER_SITE_OTHER): Payer: BLUE CROSS/BLUE SHIELD | Admitting: Family Medicine

## 2023-04-15 VITALS — BP 122/80 | HR 74 | Temp 98.1°F | Ht 69.0 in | Wt 191.2 lb

## 2023-04-15 DIAGNOSIS — Z0000 Encounter for general adult medical examination without abnormal findings: Secondary | ICD-10-CM

## 2023-04-15 MED ORDER — VALACYCLOVIR HCL 1 G PO TABS: 1000.0000 mg | ORAL_TABLET | Freq: Every day | ORAL | 5 refills | Status: AC

## 2023-04-15 NOTE — Progress Notes (Signed)
Subjective:    Patient ID: Anna Jimenez, female    DOB: 1971-07-23, 52 y.o.   MRN: [id number]  HPI Patient is a very pleasant 52 year old Caucasian female here today for a physical exam.  She continues to refrain from smoking.  She quit 3 years ago!.  Very proud of her for this.  She is due for a flu shot, tetanus shot, and a shingles vaccine.  She declines these today.  She had a mammogram earlier this year at her gynecologist.  This would be normal.  Her Pap smear was also performed at her gynecologist. Colonoscopy has been scheduled for 05/02/23.  Most recent lab work is listed below Lab on 04/09/2023  Component Date Value Ref Range Status   WBC 04/09/2023 5.3  3.8 - 10.8 Thousand/uL Final   RBC 04/09/2023 4.64  3.80 - 5.10 Million/uL Final   Hemoglobin 04/09/2023 15.0  11.7 - 15.5 g/dL Final   HCT 62/13/0865 43.9  35.0 - 45.0 % Final   MCV 04/09/2023 94.6  80.0 - 100.0 fL Final   MCH 04/09/2023 32.3  27.0 - 33.0 pg Final   MCHC 04/09/2023 34.2  32.0 - 36.0 g/dL Final   RDW 78/46/9629 11.9  11.0 - 15.0 % Final   Platelets 04/09/2023 200  140 - 400 Thousand/uL Final   MPV 04/09/2023 10.8  7.5 - 12.5 fL Final   Neutro Abs 04/09/2023 2,661  1,500 - 7,800 cells/uL Final   Lymphs Abs 04/09/2023 2,120  850 - 3,900 cells/uL Final   Absolute Monocytes 04/09/2023 398  200 - 950 cells/uL Final   Eosinophils Absolute 04/09/2023 80  15 - 500 cells/uL Final   Basophils Absolute 04/09/2023 42  0 - 200 cells/uL Final   Neutrophils Relative % 04/09/2023 50.2  % Final   Total Lymphocyte 04/09/2023 40.0  % Final   Monocytes Relative 04/09/2023 7.5  % Final   Eosinophils Relative 04/09/2023 1.5  % Final   Basophils Relative 04/09/2023 0.8  % Final   Cholesterol 04/09/2023 171  <200 mg/dL Final   HDL 52/84/1324 45 (L)  > OR = 50 mg/dL Final   Triglycerides 40/05/2724 194 (H)  <150 mg/dL Final   LDL Cholesterol (Calc) 04/09/2023 97  mg/dL (calc) Final   Comment: Reference range: <100 . Desirable  range <100 mg/dL for primary prevention;   <70 mg/dL for patients with CHD or diabetic patients  with > or = 2 CHD risk factors. Marland Kitchen LDL-C is now calculated using the Martin-Hopkins  calculation, which is a validated novel method providing  better accuracy than the Friedewald equation in the  estimation of LDL-C.  Horald Pollen et al. Lenox Ahr. 3664;403(47): 2061-2068  (http://education.[facility].com/faq/FAQ164)    Total CHOL/HDL Ratio 04/09/2023 3.8  <4.2 (calc) Final   Non-HDL Cholesterol (Calc) 04/09/2023 126  <130 mg/dL (calc) Final   Comment: For patients with diabetes plus 1 major ASCVD risk  factor, treating to a non-HDL-C goal of <100 mg/dL  (LDL-C of <59 mg/dL) is considered a therapeutic  option.    Glucose, Bld 04/09/2023 79  65 - 99 mg/dL Final   Comment: .            Fasting reference interval .    BUN 04/09/2023 9  7 - 25 mg/dL Final   Creat 56/38/7564 0.94  0.50 - 1.03 mg/dL Final   eGFR 33/29/5188 74  > OR = 60 mL/min/1.56m2 Final   BUN/Creatinine Ratio 04/09/2023 SEE NOTE:  6 - 22 (calc) Final  Comment:    Not Reported: BUN and Creatinine are within    reference range. .    Sodium 04/09/2023 137  135 - 146 mmol/L Final   Potassium 04/09/2023 4.7  3.5 - 5.3 mmol/L Final   Chloride 04/09/2023 102  98 - 110 mmol/L Final   CO2 04/09/2023 25  20 - 32 mmol/L Final   Calcium 04/09/2023 9.4  8.6 - 10.4 mg/dL Final   Total Protein 19/14/7829 7.2  6.1 - 8.1 g/dL Final   Albumin 56/21/3086 4.7  3.6 - 5.1 g/dL Final   Globulin 57/84/6962 2.5  1.9 - 3.7 g/dL (calc) Final   AG Ratio 04/09/2023 1.9  1.0 - 2.5 (calc) Final   Total Bilirubin 04/09/2023 0.6  0.2 - 1.2 mg/dL Final   Alkaline phosphatase (APISO) 04/09/2023 47  37 - 153 U/L Final   AST 04/09/2023 17  10 - 35 U/L Final   ALT 04/09/2023 19  6 - 29 U/L Final    Past Medical History:  Diagnosis Date   External hemorrhoid    Raynaud's disease    Smoker    SROM (spontaneous rupture of membranes) 05/27/2011    SVD (spontaneous vaginal delivery) 05/27/2011   URI, acute 05/16/2013   Past Surgical History:  Procedure Laterality Date   ANAL EXAMINATION UNDER ANESTHESIA  08/12/12   and I&D   MOUTH SURGERY  1999   WISDOM TOOTH EXTRACTION  90's   Current Outpatient Medications on File Prior to Visit  Medication Sig Dispense Refill   benzonatate (TESSALON) 100 MG capsule Take 1 capsule (100 mg total) by mouth 2 (two) times daily as needed for cough. (Patient not taking: Reported on 12/07/2021) 20 capsule 0   guaiFENesin (MUCINEX) 600 MG 12 hr tablet Take 1 tablet (600 mg total) by mouth 2 (two) times daily as needed for cough or to loosen phlegm. (Patient not taking: Reported on 12/07/2021) 30 tablet 0   levonorgestrel (MIRENA) 20 MCG/24HR IUD 1 each by Intrauterine route once.     sodium chloride (OCEAN) 0.65 % SOLN nasal spray Place 1 spray into both nostrils as needed for congestion. (Patient not taking: Reported on 12/07/2021) 88 mL 0   valACYclovir (VALTREX) 1000 MG tablet Take 2 tablets (2,000 mg total) by mouth 2 (two) times daily. Do this for 24 hours for cold sores. 30 tablet 3   No current facility-administered medications on file prior to visit.   Allergies  Allergen Reactions   Penicillins Other (See Comments)    Unsure- childhood reaction Unsure- childhood reaction   Social History   Socioeconomic History   Marital status: Married    Spouse name: Not on file   Number of children: Not on file   Years of education: Not on file   Highest education level: Not on file  Occupational History   Not on file  Tobacco Use   Smoking status: Some Days    Types: Cigarettes   Smokeless tobacco: Never  Substance and Sexual Activity   Alcohol use: Yes    Comment: Social   Drug use: No   Sexual activity: Yes  Other Topics Concern   Not on file  Social History Narrative   Not on file   Social Determinants of Health   Financial Resource Strain: Not on file  Food Insecurity: Not on file   Transportation Needs: Not on file  Physical Activity: Not on file  Stress: Not on file  Social Connections: Not on file  Intimate Partner Violence: Not  on file   Family History  Problem Relation Age of Onset   Hypertension Mother    Polymyositis Mother    Diabetes Maternal Grandfather    Anesthesia problems Neg Hx    Hypotension Neg Hx    Malignant hyperthermia Neg Hx    Pseudochol deficiency Neg Hx       Review of Systems  All other systems reviewed and are negative.      Objective:   Physical Exam Vitals reviewed.  Constitutional:      General: She is not in acute distress.    Appearance: She is well-developed. She is not diaphoretic.  HENT:     Head: Normocephalic and atraumatic.     Right Ear: Tympanic membrane, ear canal and external ear normal.     Left Ear: Tympanic membrane, ear canal and external ear normal.     Nose: Nose normal. No congestion or rhinorrhea.     Mouth/Throat:     Mouth: Mucous membranes are moist.     Pharynx: Oropharynx is clear. No oropharyngeal exudate or posterior oropharyngeal erythema.  Eyes:     General: No scleral icterus.       Right eye: No discharge.        Left eye: No discharge.     Conjunctiva/sclera: Conjunctivae normal.     Pupils: Pupils are equal, round, and reactive to light.  Neck:     Thyroid: No thyromegaly.     Vascular: No carotid bruit or JVD.     Trachea: No tracheal deviation.  Cardiovascular:     Rate and Rhythm: Normal rate and regular rhythm.     Heart sounds: No murmur heard.    No friction rub. No gallop.  Pulmonary:     Effort: Pulmonary effort is normal. No respiratory distress.     Breath sounds: Normal breath sounds. No stridor. No wheezing, rhonchi or rales.  Chest:     Chest wall: No tenderness.  Abdominal:     General: Bowel sounds are normal. There is no distension.     Palpations: Abdomen is soft. There is no mass.     Tenderness: There is no abdominal tenderness. There is no guarding or  rebound.  Musculoskeletal:        General: No tenderness. Normal range of motion.     Cervical back: Normal range of motion and neck supple.     Right lower leg: No edema.     Left lower leg: No edema.  Lymphadenopathy:     Cervical: No cervical adenopathy.  Skin:    General: Skin is warm.     Coloration: Skin is not pale.     Findings: No erythema.  Neurological:     General: No focal deficit present.     Mental Status: She is alert and oriented to person, place, and time.     Cranial Nerves: No cranial nerve deficit.     Motor: No weakness or abnormal muscle tone.     Coordination: Coordination normal.     Gait: Gait normal.     Deep Tendon Reflexes: Reflexes are normal and symmetric. Reflexes normal.  Psychiatric:        Behavior: Behavior normal.        Thought Content: Thought content normal.        Judgment: Judgment normal.          Assessment & Plan:  General medical exam Patient's lab work is outstanding.  I did encourage the patient to try  fish oil 2000 mg a day to help with her triglycerides and her HDL.  I congratulated the patient on her continued abstinence from tobacco.  Mammogram, Pap smear are both performed at her gynecologist and are up-to-date.  Colonoscopy has been scheduled.  The patient declines flu, tetanus, and shingles.  If she changes her mind I will be glad to give her these vaccinations.

## 2023-10-10 LAB — HM COLONOSCOPY

## 2023-10-15 ENCOUNTER — Encounter: Payer: Self-pay | Admitting: Family Medicine

## 2024-05-18 ENCOUNTER — Other Ambulatory Visit

## 2024-05-18 DIAGNOSIS — Z0000 Encounter for general adult medical examination without abnormal findings: Secondary | ICD-10-CM

## 2024-05-18 DIAGNOSIS — Z13220 Encounter for screening for lipoid disorders: Secondary | ICD-10-CM

## 2024-05-18 DIAGNOSIS — F17200 Nicotine dependence, unspecified, uncomplicated: Secondary | ICD-10-CM

## 2024-05-19 LAB — COMPLETE METABOLIC PANEL WITHOUT GFR
AG Ratio: 2 (calc) (ref 1.0–2.5)
ALT: 15 U/L (ref 6–29)
AST: 18 U/L (ref 10–35)
Albumin: 4.7 g/dL (ref 3.6–5.1)
Alkaline phosphatase (APISO): 41 U/L (ref 37–153)
BUN: 8 mg/dL (ref 7–25)
CO2: 29 mmol/L (ref 20–32)
Calcium: 9.5 mg/dL (ref 8.6–10.4)
Chloride: 102 mmol/L (ref 98–110)
Creat: 0.94 mg/dL (ref 0.50–1.03)
Globulin: 2.3 g/dL (ref 1.9–3.7)
Glucose, Bld: 73 mg/dL (ref 65–99)
Potassium: 4.9 mmol/L (ref 3.5–5.3)
Sodium: 139 mmol/L (ref 135–146)
Total Bilirubin: 0.5 mg/dL (ref 0.2–1.2)
Total Protein: 7 g/dL (ref 6.1–8.1)

## 2024-05-19 LAB — LIPID PANEL
Cholesterol: 191 mg/dL (ref <200)
HDL: 54 mg/dL (ref >=50)
LDL Cholesterol (Calc): 103 mg/dL — ABNORMAL HIGH
Non-HDL Cholesterol (Calc): 137 mg/dL — ABNORMAL HIGH (ref <130)
Total CHOL/HDL Ratio: 3.5 (calc) (ref <5.0)
Triglycerides: 223 mg/dL — ABNORMAL HIGH (ref <150)

## 2024-05-19 LAB — CBC WITH DIFFERENTIAL/PLATELET
Absolute Lymphocytes: 2354 {cells}/uL (ref 850–3900)
Absolute Monocytes: 330 {cells}/uL (ref 200–950)
Basophils Absolute: 40 {cells}/uL (ref 0–200)
Basophils Relative: 0.9 %
Eosinophils Absolute: 92 {cells}/uL (ref 15–500)
Eosinophils Relative: 2.1 %
HCT: 43.8 % (ref 35.0–45.0)
Hemoglobin: 14.4 g/dL (ref 11.7–15.5)
MCH: 31.4 pg (ref 27.0–33.0)
MCHC: 32.9 g/dL (ref 32.0–36.0)
MCV: 95.6 fL (ref 80.0–100.0)
MPV: 10.6 fL (ref 7.5–12.5)
Monocytes Relative: 7.5 %
Neutro Abs: 1584 {cells}/uL (ref 1500–7800)
Neutrophils Relative %: 36 %
Platelets: 211 Thousand/uL (ref 140–400)
RBC: 4.58 Million/uL (ref 3.80–5.10)
RDW: 12.2 % (ref 11.0–15.0)
Total Lymphocyte: 53.5 %
WBC: 4.4 Thousand/uL (ref 3.8–10.8)

## 2024-05-20 ENCOUNTER — Ambulatory Visit: Payer: Self-pay | Admitting: Family Medicine

## 2024-05-24 ENCOUNTER — Ambulatory Visit: Admitting: Family Medicine

## 2024-05-24 ENCOUNTER — Encounter: Payer: Self-pay | Admitting: Family Medicine

## 2024-05-24 VITALS — BP 124/82 | HR 64 | Temp 98.3°F | Ht 69.0 in | Wt 191.0 lb

## 2024-05-24 DIAGNOSIS — Z0000 Encounter for general adult medical examination without abnormal findings: Secondary | ICD-10-CM

## 2024-05-24 DIAGNOSIS — Z23 Encounter for immunization: Secondary | ICD-10-CM

## 2024-05-24 NOTE — Addendum Note (Signed)
 Addended by: ERIKA ELIDA RAMAN on: 05/24/2024 03:08 PM   Modules accepted: Orders

## 2024-05-24 NOTE — Progress Notes (Signed)
 Subjective:    Patient ID: Anna Jimenez, female    DOB: 1970/10/29, 53 y.o.   MRN: [id number]  HPI Patient is a very pleasant 53 year old Caucasian female here today for a physical exam.  Had colonoscopy 10/2023 with one diminutive polyp and 4 medium polyps.  Biopsy revealed 1 tubular adenoma.  Patient continues to refrain from smoking.  She quit smoking 4 years ago. The 10-year ASCVD risk score (Arnett DK, et al., 2019) is: 1.5%   Values used to calculate the score:     Age: 53 years     Clincally relevant sex: Female     Is Non-Hispanic African American: No     Diabetic: No     Tobacco smoker: No     Systolic Blood Pressure: 138 mmHg     Is BP treated: No     HDL Cholesterol: 54 mg/dL     Total Cholesterol: 191 mg/dL  Her 89-bzjm risk for ASCVD is so low, I do not feel that she requires any additional treatment or testing.  We discussed a coronary artery calcium score but she has no interest in this.  She is due for flu shot, shingles vaccine, pneumonia vaccine.  We also discussed RSV and COVID.  The patient politely declines vaccinations other than the shingles shot.  She had a mammogram earlier this year with her gynecologist that was normal.  She is due for Pap smear next year. Lab on 05/18/2024  Component Date Value Ref Range Status   WBC 05/18/2024 4.4  3.8 - 10.8 Thousand/uL Final   RBC 05/18/2024 4.58  3.80 - 5.10 Million/uL Final   Hemoglobin 05/18/2024 14.4  11.7 - 15.5 g/dL Final   HCT 89/85/7974 43.8  35.0 - 45.0 % Final   MCV 05/18/2024 95.6  80.0 - 100.0 fL Final   MCH 05/18/2024 31.4  27.0 - 33.0 pg Final   MCHC 05/18/2024 32.9  32.0 - 36.0 g/dL Final   Comment: For adults, a slight decrease in the calculated MCHC value (in the range of 30 to 32 g/dL) is most likely not clinically significant; however, it should be interpreted with caution in correlation with other red cell parameters and the patient's clinical condition.    RDW 05/18/2024 12.2  11.0 - 15.0 %  Final   Platelets 05/18/2024 211  140 - 400 Thousand/uL Final   MPV 05/18/2024 10.6  7.5 - 12.5 fL Final   Neutro Abs 05/18/2024 1,584  1,500 - 7,800 cells/uL Final   Absolute Lymphocytes 05/18/2024 2,354  850 - 3,900 cells/uL Final   Absolute Monocytes 05/18/2024 330  200 - 950 cells/uL Final   Eosinophils Absolute 05/18/2024 92  15 - 500 cells/uL Final   Basophils Absolute 05/18/2024 40  0 - 200 cells/uL Final   Neutrophils Relative % 05/18/2024 36  % Final   Total Lymphocyte 05/18/2024 53.5  % Final   Monocytes Relative 05/18/2024 7.5  % Final   Eosinophils Relative 05/18/2024 2.1  % Final   Basophils Relative 05/18/2024 0.9  % Final   Glucose, Bld 05/18/2024 73  65 - 99 mg/dL Final   Comment: .            Fasting reference interval .    BUN 05/18/2024 8  7 - 25 mg/dL Final   Creat 89/85/7974 0.94  0.50 - 1.03 mg/dL Final   BUN/Creatinine Ratio 05/18/2024 SEE NOTE:  6 - 22 (calc) Final   Comment:    Not Reported: BUN and Creatinine  are within    reference range. .    Sodium 05/18/2024 139  135 - 146 mmol/L Final   Potassium 05/18/2024 4.9  3.5 - 5.3 mmol/L Final   Chloride 05/18/2024 102  98 - 110 mmol/L Final   CO2 05/18/2024 29  20 - 32 mmol/L Final   Calcium 05/18/2024 9.5  8.6 - 10.4 mg/dL Final   Total Protein 89/85/7974 7.0  6.1 - 8.1 g/dL Final   Albumin 89/85/7974 4.7  3.6 - 5.1 g/dL Final   Globulin 89/85/7974 2.3  1.9 - 3.7 g/dL (calc) Final   AG Ratio 05/18/2024 2.0  1.0 - 2.5 (calc) Final   Total Bilirubin 05/18/2024 0.5  0.2 - 1.2 mg/dL Final   Alkaline phosphatase (APISO) 05/18/2024 41  37 - 153 U/L Final   AST 05/18/2024 18  10 - 35 U/L Final   ALT 05/18/2024 15  6 - 29 U/L Final   Cholesterol 05/18/2024 191  <200 mg/dL Final   HDL 89/85/7974 54  > OR = 50 mg/dL Final   Triglycerides 89/85/7974 223 (H)  <150 mg/dL Final   Comment: . If a non-fasting specimen was collected, consider repeat triglyceride testing on a fasting specimen if clinically indicated.   Veatrice et al. J. of Clin. Lipidol. 2015;9:129-169. SABRA    LDL Cholesterol (Calc) 05/18/2024 103 (H)  mg/dL (calc) Final   Comment: Reference range: <100 . Desirable range <100 mg/dL for primary prevention;   <70 mg/dL for patients with CHD or diabetic patients  with > or = 2 CHD risk factors. SABRA LDL-C is now calculated using the Martin-Hopkins  calculation, which is a validated novel method providing  better accuracy than the Friedewald equation in the  estimation of LDL-C.  Gladis APPLETHWAITE et al. SANDREA. 7986;689(80): 2061-2068  (http://education.[facility].com/faq/FAQ164)    Total CHOL/HDL Ratio 05/18/2024 3.5  <4.9 (calc) Final   Non-HDL Cholesterol (Calc) 05/18/2024 137 (H)  <130 mg/dL (calc) Final   Comment: For patients with diabetes plus 1 major ASCVD risk  factor, treating to a non-HDL-C goal of <100 mg/dL  (LDL-C of <29 mg/dL) is considered a therapeutic  option.     Past Medical History:  Diagnosis Date   External hemorrhoid    Raynaud's disease    Smoker    SROM (spontaneous rupture of membranes) 05/27/2011   SVD (spontaneous vaginal delivery) 05/27/2011   URI, acute 05/16/2013   Past Surgical History:  Procedure Laterality Date   ANAL EXAMINATION UNDER ANESTHESIA  08/12/12   and I&D   MOUTH SURGERY  1999   WISDOM TOOTH EXTRACTION  90's   Current Outpatient Medications on File Prior to Visit  Medication Sig Dispense Refill   benzonatate  (TESSALON ) 100 MG capsule Take 1 capsule (100 mg total) by mouth 2 (two) times daily as needed for cough. (Patient not taking: Reported on 12/07/2021) 20 capsule 0   guaiFENesin  (MUCINEX ) 600 MG 12 hr tablet Take 1 tablet (600 mg total) by mouth 2 (two) times daily as needed for cough or to loosen phlegm. (Patient not taking: Reported on 12/07/2021) 30 tablet 0   levonorgestrel (MIRENA) 20 MCG/24HR IUD 1 each by Intrauterine route once.     sodium chloride (OCEAN) 0.65 % SOLN nasal spray Place 1 spray into both nostrils as needed for  congestion. (Patient not taking: Reported on 12/07/2021) 88 mL 0   valACYclovir  (VALTREX ) 1000 MG tablet Take 1 tablet (1,000 mg total) by mouth daily. 30 tablet 5   No current facility-administered medications on  file prior to visit.   Allergies  Allergen Reactions   Penicillins Other (See Comments)    Unsure- childhood reaction Unsure- childhood reaction   Social History   Socioeconomic History   Marital status: Married    Spouse name: Not on file   Number of children: Not on file   Years of education: Not on file   Highest education level: Bachelor's degree (e.g., BA, AB, BS)  Occupational History   Not on file  Tobacco Use   Smoking status: Some Days    Types: Cigarettes   Smokeless tobacco: Never  Substance and Sexual Activity   Alcohol use: Yes    Comment: Social   Drug use: No   Sexual activity: Yes  Other Topics Concern   Not on file  Social History Narrative   Not on file   Social Drivers of Health   Financial Resource Strain: Low Risk  (05/20/2024)   Overall Financial Resource Strain (CARDIA)    Difficulty of Paying Living Expenses: Not hard at all  Food Insecurity: No Food Insecurity (05/20/2024)   Hunger Vital Sign    Worried About Running Out of Food in the Last Year: Never true    Ran Out of Food in the Last Year: Never true  Transportation Needs: No Transportation Needs (05/20/2024)   PRAPARE - Administrator, Civil Service (Medical): No    Lack of Transportation (Non-Medical): No  Physical Activity: Sufficiently Active (05/20/2024)   Exercise Vital Sign    Days of Exercise per Week: 5 days    Minutes of Exercise per Session: 50 min  Stress: No Stress Concern Present (05/20/2024)   [employer] of Occupational Health - Occupational Stress Questionnaire    Feeling of Stress: Not at all  Social Connections: Socially Integrated (05/20/2024)   Social Connection and Isolation Panel    Frequency of Communication with Friends and Family:  More than three times a week    Frequency of Social Gatherings with Friends and Family: More than three times a week    Attends Religious Services: More than 4 times per year    Active Member of [employer] or Organizations: Yes    Attends Engineer, structural: More than 4 times per year    Marital Status: Married  Catering manager Violence: Not on file   Family History  Problem Relation Age of Onset   Hypertension Mother    Polymyositis Mother    Diabetes Maternal Grandfather    Anesthesia problems Neg Hx    Hypotension Neg Hx    Malignant hyperthermia Neg Hx    Pseudochol deficiency Neg Hx       Review of Systems  All other systems reviewed and are negative.      Objective:   Physical Exam Vitals reviewed.  Constitutional:      General: She is not in acute distress.    Appearance: She is well-developed. She is not diaphoretic.  HENT:     Head: Normocephalic and atraumatic.     Right Ear: Tympanic membrane, ear canal and external ear normal.     Left Ear: Tympanic membrane, ear canal and external ear normal.     Nose: Nose normal. No congestion or rhinorrhea.     Mouth/Throat:     Mouth: Mucous membranes are moist.     Pharynx: Oropharynx is clear. No oropharyngeal exudate or posterior oropharyngeal erythema.  Eyes:     General: No scleral icterus.       Right  eye: No discharge.        Left eye: No discharge.     Conjunctiva/sclera: Conjunctivae normal.     Pupils: Pupils are equal, round, and reactive to light.  Neck:     Thyroid: No thyromegaly.     Vascular: No carotid bruit or JVD.     Trachea: No tracheal deviation.  Cardiovascular:     Rate and Rhythm: Normal rate and regular rhythm.     Heart sounds: Murmur heard.     No friction rub. No gallop.  Pulmonary:     Effort: Pulmonary effort is normal. No respiratory distress.     Breath sounds: Normal breath sounds. No stridor. No wheezing, rhonchi or rales.  Chest:     Chest wall: No tenderness.   Abdominal:     General: Bowel sounds are normal. There is no distension.     Palpations: Abdomen is soft. There is no mass.     Tenderness: There is no abdominal tenderness. There is no guarding or rebound.  Musculoskeletal:        General: No tenderness. Normal range of motion.     Cervical back: Normal range of motion and neck supple.     Right lower leg: No edema.     Left lower leg: No edema.  Lymphadenopathy:     Cervical: No cervical adenopathy.  Skin:    General: Skin is warm.     Coloration: Skin is not pale.     Findings: No erythema.  Neurological:     General: No focal deficit present.     Mental Status: She is alert and oriented to person, place, and time.     Cranial Nerves: No cranial nerve deficit.     Motor: No weakness or abnormal muscle tone.     Coordination: Coordination normal.     Gait: Gait normal.     Deep Tendon Reflexes: Reflexes are normal and symmetric. Reflexes normal.  Psychiatric:        Behavior: Behavior normal.        Thought Content: Thought content normal.        Judgment: Judgment normal.          Assessment & Plan:  General medical exam Patient's blood pressure is borderline today.  We will continue to monitor this.  Her cholesterol is slightly elevated however her 10-year risk for cardiovascular disease is very low.  She declines a flu shot.  She did receive the shingles vaccine.  She declines a pneumonia vaccine.  Colonoscopy is up-to-date.  Pap smear is due next year.  Mammogram is up-to-date.  The remainder of her lab work is excellent.  We did discuss potential to lower triglycerides as well as a low carbohydrate diet to lower triglycerides.  Patient does have a faint murmur heard best over her mitral valve.  I will monitor this clinically that she is asymptomatic at present.  Recommend calcium 1200 mg a day and vitamin D 1000 units a day

## 2024-05-25 ENCOUNTER — Encounter: Payer: Self-pay | Admitting: Family Medicine

## 2024-08-16 ENCOUNTER — Encounter: Payer: Self-pay | Admitting: Family Medicine

## 2025-05-19 ENCOUNTER — Other Ambulatory Visit

## 2025-05-26 ENCOUNTER — Encounter: Admitting: Family Medicine
